# Patient Record
Sex: Female | Born: 1950
Health system: Southern US, Community
[De-identification: ages and names within clinical notes are randomized; demographics above are authoritative.]

## PROBLEM LIST (undated history)

## (undated) DIAGNOSIS — M8580 Other specified disorders of bone density and structure, unspecified site: Secondary | ICD-10-CM

## (undated) DIAGNOSIS — D649 Anemia, unspecified: Secondary | ICD-10-CM

## (undated) DIAGNOSIS — K219 Gastro-esophageal reflux disease without esophagitis: Secondary | ICD-10-CM

## (undated) DIAGNOSIS — Z860100 Personal history of colon polyps, unspecified: Secondary | ICD-10-CM

## (undated) DIAGNOSIS — Z8052 Family history of malignant neoplasm of bladder: Secondary | ICD-10-CM

## (undated) DIAGNOSIS — M1990 Unspecified osteoarthritis, unspecified site: Secondary | ICD-10-CM

## (undated) DIAGNOSIS — I10 Essential (primary) hypertension: Secondary | ICD-10-CM

## (undated) DIAGNOSIS — Z800 Family history of malignant neoplasm of digestive organs: Secondary | ICD-10-CM

## (undated) DIAGNOSIS — Z803 Family history of malignant neoplasm of breast: Secondary | ICD-10-CM

## (undated) DIAGNOSIS — J301 Allergic rhinitis due to pollen: Secondary | ICD-10-CM

## (undated) DIAGNOSIS — Z8489 Family history of other specified conditions: Secondary | ICD-10-CM

## (undated) HISTORY — DX: Personal history of colon polyps, unspecified: Z86.0100

## (undated) HISTORY — DX: Personal history of colonic polyps: Z86.010

## (undated) HISTORY — DX: Family history of malignant neoplasm of bladder: Z80.52

## (undated) HISTORY — DX: Other specified disorders of bone density and structure, unspecified site: M85.80

## (undated) HISTORY — DX: Family history of malignant neoplasm of breast: Z80.3

## (undated) HISTORY — DX: Family history of malignant neoplasm of digestive organs: Z80.0

## (undated) HISTORY — DX: Essential (primary) hypertension: I10

---

## 1989-09-11 HISTORY — PX: PARTIAL HYSTERECTOMY: SHX80

## 1989-09-11 HISTORY — PX: ABDOMINAL HYSTERECTOMY: SHX81

## 1996-09-11 HISTORY — PX: BREAST BIOPSY: SHX20

## 1998-02-23 ENCOUNTER — Other Ambulatory Visit: Admission: RE | Admit: 1998-02-23 | Discharge: 1998-02-23 | Payer: Self-pay | Admitting: Gynecology

## 1999-02-25 ENCOUNTER — Other Ambulatory Visit: Admission: RE | Admit: 1999-02-25 | Discharge: 1999-02-25 | Payer: Self-pay | Admitting: Gynecology

## 2000-03-16 ENCOUNTER — Other Ambulatory Visit: Admission: RE | Admit: 2000-03-16 | Discharge: 2000-03-16 | Payer: Self-pay | Admitting: Gynecology

## 2000-03-19 ENCOUNTER — Encounter: Payer: Self-pay | Admitting: Gynecology

## 2000-03-19 ENCOUNTER — Encounter: Admission: RE | Admit: 2000-03-19 | Discharge: 2000-03-19 | Payer: Self-pay | Admitting: Gynecology

## 2001-03-20 ENCOUNTER — Encounter: Payer: Self-pay | Admitting: Gynecology

## 2001-03-20 ENCOUNTER — Encounter: Admission: RE | Admit: 2001-03-20 | Discharge: 2001-03-20 | Payer: Self-pay | Admitting: Gynecology

## 2001-03-20 ENCOUNTER — Other Ambulatory Visit: Admission: RE | Admit: 2001-03-20 | Discharge: 2001-03-20 | Payer: Self-pay | Admitting: Gynecology

## 2002-03-24 ENCOUNTER — Encounter: Payer: Self-pay | Admitting: Gynecology

## 2002-03-24 ENCOUNTER — Encounter: Admission: RE | Admit: 2002-03-24 | Discharge: 2002-03-24 | Payer: Self-pay | Admitting: Gynecology

## 2002-03-26 ENCOUNTER — Other Ambulatory Visit: Admission: RE | Admit: 2002-03-26 | Discharge: 2002-03-26 | Payer: Self-pay | Admitting: Gynecology

## 2003-01-20 ENCOUNTER — Encounter (INDEPENDENT_AMBULATORY_CARE_PROVIDER_SITE_OTHER): Payer: Self-pay | Admitting: Specialist

## 2003-01-20 ENCOUNTER — Ambulatory Visit (HOSPITAL_COMMUNITY): Admission: RE | Admit: 2003-01-20 | Discharge: 2003-01-20 | Payer: Self-pay | Admitting: Gastroenterology

## 2003-03-26 ENCOUNTER — Encounter: Payer: Self-pay | Admitting: Gynecology

## 2003-03-26 ENCOUNTER — Encounter: Admission: RE | Admit: 2003-03-26 | Discharge: 2003-03-26 | Payer: Self-pay | Admitting: Gynecology

## 2003-04-06 ENCOUNTER — Other Ambulatory Visit: Admission: RE | Admit: 2003-04-06 | Discharge: 2003-04-06 | Payer: Self-pay | Admitting: Gynecology

## 2003-04-29 ENCOUNTER — Encounter: Admission: RE | Admit: 2003-04-29 | Discharge: 2003-06-01 | Payer: Self-pay | Admitting: Family Medicine

## 2003-05-27 ENCOUNTER — Encounter: Admission: RE | Admit: 2003-05-27 | Discharge: 2003-05-27 | Payer: Self-pay | Admitting: Family Medicine

## 2003-05-27 ENCOUNTER — Encounter: Payer: Self-pay | Admitting: Family Medicine

## 2003-07-20 ENCOUNTER — Ambulatory Visit (HOSPITAL_COMMUNITY): Admission: RE | Admit: 2003-07-20 | Discharge: 2003-07-21 | Payer: Self-pay | Admitting: Neurosurgery

## 2003-07-20 HISTORY — PX: NECK SURGERY: SHX720

## 2004-03-28 ENCOUNTER — Encounter: Admission: RE | Admit: 2004-03-28 | Discharge: 2004-03-28 | Payer: Self-pay | Admitting: Gynecology

## 2004-04-08 ENCOUNTER — Other Ambulatory Visit: Admission: RE | Admit: 2004-04-08 | Discharge: 2004-04-08 | Payer: Self-pay | Admitting: Gynecology

## 2004-04-11 ENCOUNTER — Emergency Department (HOSPITAL_COMMUNITY): Admission: EM | Admit: 2004-04-11 | Discharge: 2004-04-11 | Payer: Self-pay | Admitting: Emergency Medicine

## 2004-04-28 ENCOUNTER — Observation Stay (HOSPITAL_COMMUNITY): Admission: RE | Admit: 2004-04-28 | Discharge: 2004-04-29 | Payer: Self-pay | Admitting: Surgery

## 2004-04-28 ENCOUNTER — Encounter (INDEPENDENT_AMBULATORY_CARE_PROVIDER_SITE_OTHER): Payer: Self-pay | Admitting: *Deleted

## 2004-04-28 HISTORY — PX: CHOLECYSTECTOMY: SHX55

## 2005-03-29 ENCOUNTER — Encounter: Admission: RE | Admit: 2005-03-29 | Discharge: 2005-03-29 | Payer: Self-pay | Admitting: Gynecology

## 2005-04-10 ENCOUNTER — Other Ambulatory Visit: Admission: RE | Admit: 2005-04-10 | Discharge: 2005-04-10 | Payer: Self-pay | Admitting: Gynecology

## 2005-04-10 ENCOUNTER — Encounter: Admission: RE | Admit: 2005-04-10 | Discharge: 2005-04-10 | Payer: Self-pay | Admitting: Gynecology

## 2006-04-13 ENCOUNTER — Other Ambulatory Visit: Admission: RE | Admit: 2006-04-13 | Discharge: 2006-04-13 | Payer: Self-pay | Admitting: Gynecology

## 2006-04-13 ENCOUNTER — Encounter: Admission: RE | Admit: 2006-04-13 | Discharge: 2006-04-13 | Payer: Self-pay | Admitting: Gynecology

## 2007-04-16 ENCOUNTER — Other Ambulatory Visit: Admission: RE | Admit: 2007-04-16 | Discharge: 2007-04-16 | Payer: Self-pay | Admitting: Gynecology

## 2007-04-16 ENCOUNTER — Encounter: Admission: RE | Admit: 2007-04-16 | Discharge: 2007-04-16 | Payer: Self-pay | Admitting: Gynecology

## 2007-04-23 ENCOUNTER — Encounter: Admission: RE | Admit: 2007-04-23 | Discharge: 2007-04-23 | Payer: Self-pay | Admitting: Gynecology

## 2008-04-20 ENCOUNTER — Encounter: Admission: RE | Admit: 2008-04-20 | Discharge: 2008-04-20 | Payer: Self-pay | Admitting: Gynecology

## 2008-05-05 ENCOUNTER — Encounter: Payer: Self-pay | Admitting: Cardiovascular Disease

## 2009-04-21 ENCOUNTER — Encounter: Admission: RE | Admit: 2009-04-21 | Discharge: 2009-04-21 | Payer: Self-pay | Admitting: Gynecology

## 2009-04-23 ENCOUNTER — Encounter: Admission: RE | Admit: 2009-04-23 | Discharge: 2009-04-23 | Payer: Self-pay | Admitting: Gynecology

## 2009-09-29 ENCOUNTER — Encounter: Admission: RE | Admit: 2009-09-29 | Discharge: 2009-10-18 | Payer: Self-pay | Admitting: Family Medicine

## 2010-04-22 ENCOUNTER — Encounter: Admission: RE | Admit: 2010-04-22 | Discharge: 2010-04-22 | Payer: Self-pay | Admitting: Gynecology

## 2010-10-02 ENCOUNTER — Encounter: Payer: Self-pay | Admitting: Gynecology

## 2010-10-03 ENCOUNTER — Encounter: Payer: Self-pay | Admitting: Gynecology

## 2011-01-27 NOTE — Op Note (Signed)
NAME:  Katherine Fisher, Katherine Fisher                            ACCOUNT NO.:  [id number]   MEDICAL RECORD NO.:  [id number]                   PATIENT TYPE:  OIB   LOCATION:  2899                                 FACILITY:  [facility]   PHYSICIAN:  Clydene Fake, M.D.               DATE OF BIRTH:  June 23, 1951   DATE OF PROCEDURE:  07/20/2003  DATE OF DISCHARGE:                                 OPERATIVE REPORT   DIAGNOSES:  Herniated nucleus pulposus and spondylosis, C3-4, with right-  sided radiculopathy.   POSTOPERATIVE DIAGNOSES:  Herniated nucleus pulposus and spondylosis, C3-4,  with right-sided radiculopathy.   PROCEDURE:  Anterior cervical decompression, diskectomy and fusion at C3-4  with LifeNet bone and tether anterior cervical plate.   SURGEON:  Clydene Fake, M.D.   ASSISTANT:  Cristi Loron, M.D.   ANESTHESIA:  General endotracheal tube anesthesia.   ESTIMATED BLOOD LOSS:  Minimal.   BLOOD GIVEN:  None.   DRAINS:  None.   COMPLICATIONS:  None.   REASON FOR PRESENTATION:  The patient is a 60 year old woman who has had  neck and right-sided shoulder pain easily brought on by ___________,  improved with traction.  MRI was done showing spondylosis and disk  herniation on the right side at 3-4 and chronic foraminal stenosis, patient  brought in for decompression and fusion at that level.   PROCEDURE IN DETAIL:  The patient was brought to the operating room and  general anesthesia induced.  The patient was placed in 10 pounds of halter  traction and prepped and draped in a sterile fashion.  Site of incision was  injected with 10 mL of 1% lidocaine with epinephrine.  Incision was then  made from the midline to the anterior border of the sternomastoid muscle on  the left side of the neck and incision taken down to the level of the  platysmal and hemostasis obtained with Bovie cauterization.  The platysma  was incised with a Bovie and blunt dissection was taken from the anterior  cervical fascia to the anterior cervical spine.  Needle was placed in the  interspace and x-rays obtained showing this was indeed the 3-4 interspace.  This disk space was incised with a 15 blade and a partial diskectomy was  performed with pituitary rongeurs as the needle was removed.  The longus  colli muscle was reflected laterally on each side using the Bovie and a self-  retaining retractor was placed.  Distraction pins were placed in C3-4 and  the interspace distracted and diskectomy was then performed by again  incising the disk space with a 15 blade and performing diskectomy with  pituitary rongeurs and curettes.  Microscope was brought into the field for  microdissection at this point and 1- and 2-mm Kerrison punches were used to  remove posterior osteophytes from posterior ligaments and perform bilateral  foraminotomies after compression of the roots.  Once we had good central and  lateral decompression, a high-speed drill was used to remove the  cartilaginous endplate.  We measured the height of the disk space using the  LifeNet allograft bone trials.  It measured for a 6-mm graft.  We used a 6-  mm broach to roughen the endplates and irrigated antibiotic solution.  Wound  had good hemostasis.  We tapped a 6-mm LifeNet allograft bone into place.  There was plenty of room for treating the posterior aspect of the bone and  the dura as we checked with a nerve hook.  We removed the weights from the  traction and the distraction pins.  Hemostasis was obtained with Gelfoam  soaked in thrombin and a tether anterior cervical plate was placed over the  anterior cervical spine and two screws placed at the end of C3 and two at  C4.  These were tightened down.  Lateral x-rays were obtained showing good  position of the plate and screws and interbody bone plug, good alignment of  the spine.  Retractors were all removed and hemostasis obtained with Gelfoam  soaked in thrombin and bipolar  cauterization.  Gelfoam then irrigated out  and when we had adequate hemostasis, the platysmal was closed with 3-0  Vicryl interrupted sutures, the subcutaneous tissue was closed with the same  and the skin was closed with Benzoin and Steri-Strips.  Dressing was placed.  The patient was placed in a soft cervical collar, awoken from anesthesia and  transferred to the recovery room in stable condition.                                               Clydene Fake, M.D.    JRH/MEDQ  D:  07/20/2003  T:  07/20/2003  Job:  [id number]

## 2011-01-27 NOTE — Op Note (Signed)
NAME:  Katherine Fisher, Katherine Fisher                            ACCOUNT NO.:  [id number]   MEDICAL RECORD NO.:  [id number]                   PATIENT TYPE:  OBV   LOCATION:  1610                                 FACILITY:  [facility]   PHYSICIAN:  Currie Paris, M.D.           DATE OF BIRTH:  [birth year]   DATE OF PROCEDURE:  04/28/2004  DATE OF DISCHARGE:                                 OPERATIVE REPORT   OFFICE MEDICAL RECORD NUMBER:  [id number]   PREOPERATIVE DIAGNOSIS:  Chronic calculus cholecystitis.   POSTOPERATIVE DIAGNOSES:  1. Chronic calculus cholecystitis.  2. Multiple intra-abdominal adhesions.   OPERATION:  1. Laparoscopic cholecystectomy with operative cholangiogram.  2. Laparoscopic lysis of adhesions.   SURGEON:  Dr. Jamey Ripa   ASSISTANT:  Dr. Francina Ames   ANESTHESIA:  General endotracheal.   CLINICAL HISTORY:  This patient has been having epigastric pain associated  with nausea, first came after eating steak and [place] fries, and work-up has  shown gallstones.   DESCRIPTION OF PROCEDURE:  The patient was seen in the holding area and had  no further questions about her surgery.  She was taken to the operating room  and after satisfactory general endotracheal anesthesia had been obtained,  the abdomen was prepped and draped.  Marcaine 0.25% plain was used for the  incisions.   An umbilical incision was made, the fascia opened, and the peritoneal cavity  entered.  A Hasson was introduced and abdomen insufflated.  When I put the  camera in, I saw that we were underneath the omentum.  I could not really  get a good window into the left upper quadrant, so I took the trocar out and  tried to free up some of the omental adhesions digitally, put the trocar  back, got a little bit more exposure.  I did this one more time, but clearly  I was lucky to be able to get into the right upper quadrant at all freely.  However, I could see just to the left of the falciform, and the omentum was  stuck the entire length of the falciform.  At this point, under direct  vision, I put a 10-11 into the left upper quadrant, left epigastrium so that  we could work on some of these adhesions, and I was able to take a few  sharply down and get a little bit of the left mid abdomen freed up.  At this  point, I put a 5 mm into the left mid abdomen and using the camera in the  umbilical port and scissors and dissector through the 5 mm port, was able to  free up the adhesions of the omentum down from the anterior abdominal wall  in the right upper quadrant and from the falciform.  I then was able to  visualize the liver, the gallbladder, and the rest of the right upper  quadrant appeared to be free once these omental  adhesions came down.  At  this point, two 5 mm trocars were placed in the right upper quadrant.  The  patient was placed in reverse Trendelenburg and the procedure proceeded.  We  spent 30 minutes getting the adhesions down.   The gallbladder was retracted over the liver.  The peritoneum over the  cystic duct area was opened and the cystic duct dissected out.  The cystic  artery was closely attached to the cystic duct, and it was stripped off,  clipped, and divided.  I opened the peritoneum on either side of the  gallbladder to make a nice, big window, saw a posterior branch of the artery  which was temporarily left alone.   A clip was placed in the cystic duct at its junction with the gallbladder,  and it was opened.  Cook catheter was then introduced into the cystic duct  and held with clip and operative cholangiography done.  This showed a normal  common duct, good filling of the duodenum and pancreatic duct, and filling  of the hepatic radicles.   The cystic duct catheter was removed and three clips placed on the stay  side, and it was divided.  The small posterior branch of the cystic artery  was clipped and divided.  The gallbladder was removed from below to above  and  placed in a bag.   We irrigated and made sure everything was dry, pulled the gallbladder out  through the umbilical port.  I reinserted the port, reirrigated, and made a  final check for hemostasis to make sure there was no blood or bile leakage,  and everything appeared okay.  The ports were removed, first taking all the  5 mm out and then the umbilical port with that port site being closed with a  pursestring.  The abdomen was deflated through the epigastric port.  The  skin was closed with 4-0 Monocryl subcuticular and Dermabond.   The patient tolerated the procedure well.  There were no operative  complications.  All counts were correct.                                               Currie Paris, M.D.    CJS/MEDQ  D:  04/28/2004  T:  04/28/2004  Job:  [id number]   cc:   Sigmund Hazel, M.D.  [address]  [address] [place], [place] [address]  Fax: [phone]   Gretta Cool, M.D.  [address] [place]  [place] [address]  Fax: [phone]

## 2011-03-06 ENCOUNTER — Other Ambulatory Visit: Payer: Self-pay | Admitting: Gastroenterology

## 2011-03-31 ENCOUNTER — Other Ambulatory Visit: Payer: Self-pay | Admitting: Gynecology

## 2011-03-31 DIAGNOSIS — Z1231 Encounter for screening mammogram for malignant neoplasm of breast: Secondary | ICD-10-CM

## 2011-04-24 ENCOUNTER — Ambulatory Visit
Admission: RE | Admit: 2011-04-24 | Discharge: 2011-04-24 | Disposition: A | Discharge status: Home / Self Care | Payer: BC Managed Care – PPO | Source: Ambulatory Visit | Attending: Gynecology | Admitting: Gynecology

## 2011-04-24 DIAGNOSIS — Z1231 Encounter for screening mammogram for malignant neoplasm of breast: Secondary | ICD-10-CM

## 2011-05-04 ENCOUNTER — Other Ambulatory Visit: Payer: Self-pay | Admitting: Gynecology

## 2012-02-10 ENCOUNTER — Encounter: Payer: Self-pay | Admitting: *Deleted

## 2012-02-16 ENCOUNTER — Other Ambulatory Visit: Payer: Self-pay | Admitting: Specialist

## 2012-02-16 DIAGNOSIS — M4800 Spinal stenosis, site unspecified: Secondary | ICD-10-CM

## 2012-02-27 ENCOUNTER — Ambulatory Visit
Admission: RE | Admit: 2012-02-27 | Discharge: 2012-02-27 | Disposition: A | Discharge status: Home / Self Care | Payer: BC Managed Care – PPO | Source: Ambulatory Visit | Attending: Specialist | Admitting: Specialist

## 2012-02-27 VITALS — BP 102/60 | HR 55 | Ht 66.0 in | Wt 150.0 lb

## 2012-02-27 DIAGNOSIS — M4800 Spinal stenosis, site unspecified: Secondary | ICD-10-CM

## 2012-02-27 MED ORDER — IOHEXOL 180 MG/ML  SOLN
15.0000 mL | Freq: Once | INTRAMUSCULAR | Status: AC | PRN
  Administered 2012-02-27: 15 mL via INTRATHECAL

## 2012-02-27 MED ORDER — DIAZEPAM 5 MG PO TABS
10.0000 mg | ORAL_TABLET | Freq: Once | ORAL | Status: AC
  Administered 2012-02-27: 10 mg via ORAL

## 2012-02-27 MED ORDER — ONDANSETRON HCL 4 MG/2ML IJ SOLN: 4.0000 mg | Freq: Four times a day (QID) | INTRAMUSCULAR | Status: DC | PRN

## 2012-02-27 NOTE — Discharge Instructions (Signed)

## 2012-02-29 ENCOUNTER — Other Ambulatory Visit: Payer: Self-pay | Admitting: Gynecology

## 2012-02-29 DIAGNOSIS — Z1231 Encounter for screening mammogram for malignant neoplasm of breast: Secondary | ICD-10-CM

## 2012-04-24 ENCOUNTER — Ambulatory Visit
Admission: RE | Admit: 2012-04-24 | Discharge: 2012-04-24 | Disposition: A | Discharge status: Home / Self Care | Payer: BC Managed Care – PPO | Source: Ambulatory Visit | Attending: Gynecology | Admitting: Gynecology

## 2012-04-24 DIAGNOSIS — Z1231 Encounter for screening mammogram for malignant neoplasm of breast: Secondary | ICD-10-CM

## 2012-05-06 ENCOUNTER — Other Ambulatory Visit: Payer: Self-pay | Admitting: Gynecology

## 2012-07-06 ENCOUNTER — Other Ambulatory Visit: Payer: Self-pay | Admitting: Specialist

## 2012-07-22 ENCOUNTER — Encounter (HOSPITAL_COMMUNITY): Payer: Self-pay

## 2012-07-22 ENCOUNTER — Encounter (HOSPITAL_COMMUNITY)
Admission: RE | Admit: 2012-07-22 | Discharge: 2012-07-22 | Disposition: A | Discharge status: Home / Self Care | Payer: BC Managed Care – PPO | Source: Ambulatory Visit | Attending: Specialist | Admitting: Specialist

## 2012-07-22 HISTORY — DX: Gastro-esophageal reflux disease without esophagitis: K21.9

## 2012-07-22 HISTORY — DX: Unspecified osteoarthritis, unspecified site: M19.90

## 2012-07-22 LAB — SURGICAL PCR SCREEN
MRSA, PCR: NEGATIVE
Staphylococcus aureus: NEGATIVE

## 2012-07-22 NOTE — Patient Instructions (Signed)
20 Katherine Fisher  07/22/2012   Your procedure is scheduled on:  07/25/12 1000am-12noon  Report to [facility] Stay [facility] at 0730 AM.  Call this number if you have problems the morning of surgery: [phone]   Remember:   Do not eat food:After Midnight.  May have clear liquids:until Midnight .    Take these medicines the morning of surgery with A SIP OF WATER:   Do not wear jewelry, make-up or nail polish.  Do not wear lotions, powders, or perfumes.   Do not shave 48 hours prior to surgery.   Do not bring valuables to the hospital.  Contacts, dentures or bridgework may not be worn into surgery.  Leave suitcase in the car. After surgery it may be brought to your room.  For patients admitted to the hospital, checkout time is 11:00 AM the day of discharge.                SEE CHG INSTRUCTION SHEET    Please read over the following fact sheets that you were given: MRSA Information, Incentive Spirometry Fact sheet, coughing and deep breathing exercises, leg exercises

## 2012-07-25 ENCOUNTER — Encounter (HOSPITAL_COMMUNITY): Payer: Self-pay | Admitting: *Deleted

## 2012-07-25 ENCOUNTER — Ambulatory Visit (HOSPITAL_COMMUNITY): Payer: BC Managed Care – PPO

## 2012-07-25 ENCOUNTER — Observation Stay (HOSPITAL_COMMUNITY)
Admission: RE | Admit: 2012-07-25 | Discharge: 2012-07-26 | Disposition: A | Discharge status: Home / Self Care | Payer: BC Managed Care – PPO | Source: Ambulatory Visit | Attending: Specialist | Admitting: Specialist

## 2012-07-25 ENCOUNTER — Encounter (HOSPITAL_COMMUNITY)
Admission: RE | Disposition: A | Discharge status: Home / Self Care | Payer: Self-pay | Source: Ambulatory Visit | Attending: Specialist

## 2012-07-25 ENCOUNTER — Encounter (HOSPITAL_COMMUNITY): Payer: Self-pay | Admitting: Anesthesiology

## 2012-07-25 ENCOUNTER — Ambulatory Visit (HOSPITAL_COMMUNITY): Payer: BC Managed Care – PPO | Admitting: Anesthesiology

## 2012-07-25 DIAGNOSIS — M129 Arthropathy, unspecified: Secondary | ICD-10-CM | POA: Insufficient documentation

## 2012-07-25 DIAGNOSIS — Z01812 Encounter for preprocedural laboratory examination: Secondary | ICD-10-CM | POA: Insufficient documentation

## 2012-07-25 DIAGNOSIS — M48061 Spinal stenosis, lumbar region without neurogenic claudication: Principal | ICD-10-CM

## 2012-07-25 DIAGNOSIS — Z79899 Other long term (current) drug therapy: Secondary | ICD-10-CM | POA: Insufficient documentation

## 2012-07-25 DIAGNOSIS — K219 Gastro-esophageal reflux disease without esophagitis: Secondary | ICD-10-CM | POA: Insufficient documentation

## 2012-07-25 DIAGNOSIS — I10 Essential (primary) hypertension: Secondary | ICD-10-CM | POA: Insufficient documentation

## 2012-07-25 DIAGNOSIS — Z7982 Long term (current) use of aspirin: Secondary | ICD-10-CM | POA: Insufficient documentation

## 2012-07-25 HISTORY — PX: LUMBAR LAMINECTOMY/DECOMPRESSION MICRODISCECTOMY: SHX5026

## 2012-07-25 SURGERY — LUMBAR LAMINECTOMY/DECOMPRESSION MICRODISCECTOMY 2 LEVELS
Anesthesia: General | Laterality: Left | Wound class: Clean

## 2012-07-25 MED ORDER — BUPIVACAINE-EPINEPHRINE 0.5% -1:200000 IJ SOLN
INTRAMUSCULAR | Status: DC | PRN
  Administered 2012-07-25: 10 mL

## 2012-07-25 MED ORDER — ACETAMINOPHEN 10 MG/ML IV SOLN
INTRAVENOUS | Status: DC | PRN
  Administered 2012-07-25: 1000 mg via INTRAVENOUS

## 2012-07-25 MED ORDER — HYDROMORPHONE HCL PF 1 MG/ML IJ SOLN: 0.2500 mg | INTRAMUSCULAR | Status: DC | PRN

## 2012-07-25 MED ORDER — VITAMIN B-12 1000 MCG PO TABS
1000.0000 ug | ORAL_TABLET | Freq: Every day | ORAL | Status: DC
  Administered 2012-07-25 – 2012-07-26 (×2): 1000 ug via ORAL
  Filled 2012-07-25 (×2): qty 1

## 2012-07-25 MED ORDER — HYDROCODONE-ACETAMINOPHEN 7.5-325 MG PO TABS: 1.0000 | ORAL_TABLET | ORAL | Status: DC | PRN

## 2012-07-25 MED ORDER — BUPIVACAINE-EPINEPHRINE (PF) 0.5% -1:200000 IJ SOLN
INTRAMUSCULAR | Status: AC
  Filled 2012-07-25: qty 10

## 2012-07-25 MED ORDER — SODIUM CHLORIDE 0.9 % IR SOLN
Status: DC | PRN
  Administered 2012-07-25: 10:00:00

## 2012-07-25 MED ORDER — MIDAZOLAM HCL 5 MG/5ML IJ SOLN
INTRAMUSCULAR | Status: DC | PRN
  Administered 2012-07-25: 2 mg via INTRAVENOUS

## 2012-07-25 MED ORDER — SODIUM CHLORIDE 0.45 % IV SOLN: INTRAVENOUS | Status: DC

## 2012-07-25 MED ORDER — HEMOSTATIC AGENTS (NO CHARGE) OPTIME
TOPICAL | Status: DC | PRN
  Administered 2012-07-25: 1 via TOPICAL

## 2012-07-25 MED ORDER — LORATADINE 10 MG PO TABS
10.0000 mg | ORAL_TABLET | Freq: Every day | ORAL | Status: DC | PRN
  Filled 2012-07-25: qty 1

## 2012-07-25 MED ORDER — HYDROCODONE-ACETAMINOPHEN 5-325 MG PO TABS
1.0000 | ORAL_TABLET | ORAL | Status: DC | PRN
  Administered 2012-07-25 – 2012-07-26 (×3): 1 via ORAL
  Filled 2012-07-25 (×3): qty 1

## 2012-07-25 MED ORDER — METHOCARBAMOL 500 MG PO TABS: 500.0000 mg | ORAL_TABLET | Freq: Three times a day (TID) | ORAL | Status: DC

## 2012-07-25 MED ORDER — ONDANSETRON HCL 4 MG/2ML IJ SOLN: 4.0000 mg | INTRAMUSCULAR | Status: DC | PRN

## 2012-07-25 MED ORDER — CISATRACURIUM BESYLATE (PF) 10 MG/5ML IV SOLN
INTRAVENOUS | Status: DC | PRN
  Administered 2012-07-25: 10 mg via INTRAVENOUS

## 2012-07-25 MED ORDER — DEXAMETHASONE SODIUM PHOSPHATE 10 MG/ML IJ SOLN
INTRAMUSCULAR | Status: DC | PRN
  Administered 2012-07-25: 10 mg via INTRAVENOUS

## 2012-07-25 MED ORDER — ACETAMINOPHEN 650 MG RE SUPP: 650.0000 mg | RECTAL | Status: DC | PRN

## 2012-07-25 MED ORDER — PROMETHAZINE HCL 25 MG/ML IJ SOLN
INTRAMUSCULAR | Status: AC
  Filled 2012-07-25: qty 1

## 2012-07-25 MED ORDER — FENTANYL CITRATE 0.05 MG/ML IJ SOLN
INTRAMUSCULAR | Status: DC | PRN
  Administered 2012-07-25 (×2): 50 ug via INTRAVENOUS
  Administered 2012-07-25: 100 ug via INTRAVENOUS

## 2012-07-25 MED ORDER — LACTATED RINGERS IV SOLN: INTRAVENOUS | Status: DC

## 2012-07-25 MED ORDER — MENTHOL 3 MG MT LOZG: 1.0000 | LOZENGE | OROMUCOSAL | Status: DC | PRN

## 2012-07-25 MED ORDER — EPHEDRINE SULFATE 50 MG/ML IJ SOLN
INTRAMUSCULAR | Status: DC | PRN
  Administered 2012-07-25 (×2): 10 mg via INTRAVENOUS
  Administered 2012-07-25: 7.5 mg via INTRAVENOUS
  Administered 2012-07-25: 10 mg via INTRAVENOUS
  Administered 2012-07-25: 7.5 mg via INTRAVENOUS

## 2012-07-25 MED ORDER — ACETAMINOPHEN 325 MG PO TABS: 650.0000 mg | ORAL_TABLET | ORAL | Status: DC | PRN

## 2012-07-25 MED ORDER — ONDANSETRON HCL 4 MG/2ML IJ SOLN
INTRAMUSCULAR | Status: DC | PRN
  Administered 2012-07-25: 4 mg via INTRAVENOUS

## 2012-07-25 MED ORDER — CEFAZOLIN SODIUM-DEXTROSE 2-3 GM-% IV SOLR
2.0000 g | Freq: Three times a day (TID) | INTRAVENOUS | Status: AC
  Administered 2012-07-25 – 2012-07-26 (×2): 2 g via INTRAVENOUS
  Filled 2012-07-25 (×2): qty 50

## 2012-07-25 MED ORDER — PROMETHAZINE HCL 25 MG/ML IJ SOLN
6.2500 mg | INTRAMUSCULAR | Status: DC | PRN
  Administered 2012-07-25: 6.25 mg via INTRAVENOUS

## 2012-07-25 MED ORDER — PANTOPRAZOLE SODIUM 40 MG IV SOLR
40.0000 mg | Freq: Every day | INTRAVENOUS | Status: DC
  Filled 2012-07-25 (×2): qty 40

## 2012-07-25 MED ORDER — THROMBIN 5000 UNITS EX SOLR
CUTANEOUS | Status: DC | PRN
  Administered 2012-07-25: 10000 [IU] via TOPICAL

## 2012-07-25 MED ORDER — DOCUSATE SODIUM 100 MG PO CAPS
100.0000 mg | ORAL_CAPSULE | Freq: Two times a day (BID) | ORAL | Status: DC
  Administered 2012-07-25 – 2012-07-26 (×2): 100 mg via ORAL

## 2012-07-25 MED ORDER — LEVOCETIRIZINE DIHYDROCHLORIDE 5 MG PO TABS: 5.0000 mg | ORAL_TABLET | Freq: Every evening | ORAL | Status: DC

## 2012-07-25 MED ORDER — ACETAMINOPHEN 10 MG/ML IV SOLN
INTRAVENOUS | Status: AC
  Filled 2012-07-25: qty 100

## 2012-07-25 MED ORDER — THROMBIN 5000 UNITS EX SOLR
CUTANEOUS | Status: AC
  Filled 2012-07-25: qty 10000

## 2012-07-25 MED ORDER — CHLORHEXIDINE GLUCONATE 4 % EX LIQD: 60.0000 mL | Freq: Once | CUTANEOUS | Status: DC

## 2012-07-25 MED ORDER — MORPHINE SULFATE 2 MG/ML IJ SOLN: 1.0000 mg | INTRAMUSCULAR | Status: DC | PRN

## 2012-07-25 MED ORDER — OXYCODONE-ACETAMINOPHEN 5-325 MG PO TABS: 1.0000 | ORAL_TABLET | ORAL | Status: DC | PRN

## 2012-07-25 MED ORDER — PROPOFOL 10 MG/ML IV BOLUS
INTRAVENOUS | Status: DC | PRN
  Administered 2012-07-25: 110 mg via INTRAVENOUS

## 2012-07-25 MED ORDER — LACTATED RINGERS IV SOLN
INTRAVENOUS | Status: DC
  Administered 2012-07-25: 1000 mL via INTRAVENOUS
  Administered 2012-07-25 (×2): via INTRAVENOUS

## 2012-07-25 MED ORDER — CEFAZOLIN SODIUM-DEXTROSE 2-3 GM-% IV SOLR
2.0000 g | INTRAVENOUS | Status: AC
  Administered 2012-07-25: 2 g via INTRAVENOUS

## 2012-07-25 MED ORDER — PHENOL 1.4 % MT LIQD: 1.0000 | OROMUCOSAL | Status: DC | PRN

## 2012-07-25 SURGICAL SUPPLY — 51 items
APL SKNCLS STERI-STRIP NONHPOA (GAUZE/BANDAGES/DRESSINGS) ×2
BAG SPEC THK2 15X12 ZIP CLS (MISCELLANEOUS) ×1
BAG ZIPLOCK 12X15 (MISCELLANEOUS) ×2 IMPLANT
BENZOIN TINCTURE PRP APPL 2/3 (GAUZE/BANDAGES/DRESSINGS) ×4 IMPLANT
CHLORAPREP W/TINT 26ML (MISCELLANEOUS) IMPLANT
CLEANER TIP ELECTROSURG 2X2 (MISCELLANEOUS) ×2 IMPLANT
CLOTH BEACON ORANGE TIMEOUT ST (SAFETY) ×2 IMPLANT
DECANTER SPIKE VIAL GLASS SM (MISCELLANEOUS) ×2 IMPLANT
DRAPE MICROSCOPE LEICA (MISCELLANEOUS) ×2 IMPLANT
DRAPE POUCH INSTRU U-SHP 10X18 (DRAPES) ×2 IMPLANT
DRAPE SURG 17X11 SM STRL (DRAPES) ×2 IMPLANT
DRSG EMULSION OIL 3X3 NADH (GAUZE/BANDAGES/DRESSINGS) ×1 IMPLANT
DRSG PAD ABDOMINAL 8X10 ST (GAUZE/BANDAGES/DRESSINGS) IMPLANT
DRSG TELFA 4X5 ISLAND ADH (GAUZE/BANDAGES/DRESSINGS) IMPLANT
DRSG TELFA PLUS 4X6 ADH ISLAND (GAUZE/BANDAGES/DRESSINGS) ×1 IMPLANT
DURAPREP 26ML APPLICATOR (WOUND CARE) ×2 IMPLANT
ELECT BLADE TIP CTD 4 INCH (ELECTRODE) ×1 IMPLANT
ELECT REM PT RETURN 9FT ADLT (ELECTROSURGICAL) ×2
ELECTRODE REM PT RTRN 9FT ADLT (ELECTROSURGICAL) ×1 IMPLANT
GLOVE BIOGEL PI IND STRL 8 (GLOVE) ×1 IMPLANT
GLOVE BIOGEL PI INDICATOR 8 (GLOVE) ×1
GLOVE ECLIPSE 6.5 STRL STRAW (GLOVE) ×2 IMPLANT
GLOVE INDICATOR 6.5 STRL GRN (GLOVE) ×4 IMPLANT
GLOVE SURG SS PI 8.0 STRL IVOR (GLOVE) ×4 IMPLANT
GOWN PREVENTION PLUS LG XLONG (DISPOSABLE) ×2 IMPLANT
GOWN STRL REIN XL XLG (GOWN DISPOSABLE) ×2 IMPLANT
KIT BASIN OR (CUSTOM PROCEDURE TRAY) ×2 IMPLANT
KIT POSITIONING SURG ANDREWS (MISCELLANEOUS) ×2 IMPLANT
MANIFOLD NEPTUNE II (INSTRUMENTS) ×2 IMPLANT
NDL SPNL 18GX3.5 QUINCKE PK (NEEDLE) ×3 IMPLANT
NEEDLE SPNL 18GX3.5 QUINCKE PK (NEEDLE) ×6 IMPLANT
PATTIES SURGICAL .5 X.5 (GAUZE/BANDAGES/DRESSINGS) IMPLANT
PATTIES SURGICAL .75X.75 (GAUZE/BANDAGES/DRESSINGS) IMPLANT
PATTIES SURGICAL 1X1 (DISPOSABLE) IMPLANT
SPONGE SURGIFOAM ABS GEL 100 (HEMOSTASIS) ×2 IMPLANT
STAPLER VISISTAT (STAPLE) IMPLANT
STRIP CLOSURE SKIN 1/2X4 (GAUZE/BANDAGES/DRESSINGS) ×1 IMPLANT
SUT PROLENE 3 0 PS 2 (SUTURE) IMPLANT
SUT VIC AB 0 CT1 27 (SUTURE)
SUT VIC AB 0 CT1 27XBRD ANTBC (SUTURE) IMPLANT
SUT VIC AB 1 CT1 27 (SUTURE) ×2
SUT VIC AB 1 CT1 27XBRD ANTBC (SUTURE) ×1 IMPLANT
SUT VIC AB 1-0 CT2 27 (SUTURE) IMPLANT
SUT VIC AB 2-0 CT1 27 (SUTURE) ×2
SUT VIC AB 2-0 CT1 TAPERPNT 27 (SUTURE) ×1 IMPLANT
SUT VIC AB 2-0 CT2 27 (SUTURE) ×4 IMPLANT
SUT VICRYL 0 27 CT2 27 ABS (SUTURE) ×4 IMPLANT
SUT VICRYL 0 UR6 27IN ABS (SUTURE) IMPLANT
SYRINGE 10CC LL (SYRINGE) ×4 IMPLANT
TRAY LAMINECTOMY (CUSTOM PROCEDURE TRAY) ×2 IMPLANT
YANKAUER SUCT BULB TIP NO VENT (SUCTIONS) ×2 IMPLANT

## 2012-07-25 NOTE — Brief Op Note (Signed)
07/25/2012  12:34 PM  PATIENT:  Katherine Fisher  61 y.o. female  PRE-OPERATIVE DIAGNOSIS:  Spinal stenosis L45  POST-OPERATIVE DIAGNOSIS:  Spinal stenosis  PROCEDURE:  Procedure(s) (LRB) with comments: LUMBAR LAMINECTOMY/DECOMPRESSION MICRODISCECTOMY 2 LEVELS (Left) - Lumbar decompression L3-L4, L4 - L5 on the Left (X-Ray)  SURGEON:  Surgeon(s) and Role:    * Javier Docker, MD - Primary  PHYSICIAN ASSISTANT:   ASSISTANTS: Irish Elders   ANESTHESIA:   general  EBL:  Total I/O In: 2000 [I.V.:2000] Out: -   BLOOD ADMINISTERED:none  DRAINS: none   LOCAL MEDICATIONS USED:  MARCAINE     SPECIMEN:  No Specimen  DISPOSITION OF SPECIMEN:  N/A  COUNTS:  YES  TOURNIQUET:  * No tourniquets in log *  DICTATION: .Other Dictation: Dictation Number [id number]  PLAN OF CARE: Admit for overnight observation  PATIENT DISPOSITION:  PACU - hemodynamically stable.   Delay start of Pharmacological VTE agent (>24hrs) due to surgical blood loss or risk of bleeding: yes

## 2012-07-25 NOTE — H&P (Signed)
Katherine Fisher is an 61 y.o. female.   Chief Complaint: Left leg pain HPI: L5 radiculopathy refractory  Past Medical History  Diagnosis Date  . HTN (hypertension)   . GERD (gastroesophageal reflux disease)     hx of   . Arthritis     lower anemia     Past Surgical History  Procedure Date  . Partial hysterectomy 1991  . Cholecystectomy 2006  . Breast biopsy 1998    x2 on right     Family History  Problem Relation Age of Onset  . Heart attack Father   . Hypertension Mother   . Heart failure Brother   . Heart failure Brother    Social History:  reports that she has never smoked. She has never used smokeless tobacco. She reports that she does not drink alcohol or use illicit drugs.  Allergies: No Known Allergies  Medications Prior to Admission  Medication Sig Dispense Refill  . Cholecalciferol (D3 SUPER STRENGTH) 2000 UNITS CAPS Take by mouth daily.       . Cyclobenzaprine HCl (FLEXERIL PO) Take 10 mg by mouth every 6 (six) hours as needed. spasms      . fluticasone (FLONASE) 50 MCG/ACT nasal spray Place 2 sprays into the nose daily as needed. allergies      . levocetirizine (XYZAL) 5 MG tablet Take 5 mg by mouth every evening. As needed      . lisinopril-hydrochlorothiazide (PRINZIDE,ZESTORETIC) 20-12.5 MG per tablet Take 1 tablet by mouth every morning.       . naproxen (NAPROSYN) 500 MG tablet Take 500 mg by mouth 2 (two) times daily as needed. Pain      . aspirin 81 MG tablet Take 81 mg by mouth every morning.       . conjugated estrogens (PREMARIN) vaginal cream Place 1 g vaginally See admin instructions. Twice weekly (Sunday and Tuesday)      . fish oil-omega-3 fatty acids 1000 MG capsule Take 1 g by mouth daily.       . vitamin B-12 (CYANOCOBALAMIN) 1000 MCG tablet Take 1,000 mcg by mouth daily.        No results found for this or any previous visit (from the past 48 hour(s)). No results found.  Review of Systems  Neurological: Positive for tingling and focal  weakness.  All other systems reviewed and are negative.    Blood pressure 127/76, pulse 68, temperature 97.5 F (36.4 C), temperature source Oral, resp. rate 18, height 5\' 4"  (1.626 m), weight 67.132 kg (148 lb), SpO2 99.00%. Physical Exam  Vitals reviewed. Constitutional: She is oriented to person, place, and time. She appears well-developed.  HENT:  Head: Normocephalic.  Eyes: Pupils are equal, round, and reactive to light.  Neck: Normal range of motion.  Cardiovascular: Normal rate.   Respiratory: Effort normal.  GI: Soft.  Musculoskeletal:       +SLR left. EHL 4/5 left. No dvt. 2+pulses  Neurological: She is alert and oriented to person, place, and time.  Skin: Skin is warm and dry.  Psychiatric: She has a normal mood and affect.  MRI stenosis L45 min listhesis left.   Assessment/Plan L45 stenosis left with L5 radiculopathy refractory. Plan decompression. Risks discussed. Min back pain  Elvera Almario C 07/25/2012, 9:23 AM

## 2012-07-25 NOTE — Anesthesia Postprocedure Evaluation (Signed)
Anesthesia Post Note  Patient: Katherine Fisher  Procedure(s) Performed: Procedure(s) (LRB): LUMBAR LAMINECTOMY/DECOMPRESSION MICRODISCECTOMY 2 LEVELS (Left)  Anesthesia type: General  Patient location: PACU  Post pain: Pain level controlled  Post assessment: Post-op Vital signs reviewed  Last Vitals:  Filed Vitals:   07/25/12 1345  BP: 124/63  Pulse: 87  Temp:   Resp: 13    Post vital signs: Reviewed  Level of consciousness: sedated  Complications: No apparent anesthesia complications

## 2012-07-25 NOTE — Anesthesia Preprocedure Evaluation (Signed)
Anesthesia Evaluation  Patient identified by MRN, date of birth, ID band Patient awake    Reviewed: Allergy & Precautions, H&P , NPO status , Patient's Chart, lab work & pertinent test results  Airway Mallampati: II TM Distance: >3 FB Neck ROM: Full    Dental  (+) Caps, Missing, Teeth Intact and Dental Advisory Given,    Pulmonary neg pulmonary ROS,  breath sounds clear to auscultation  Pulmonary exam normal       Cardiovascular hypertension, Pt. on medications Rhythm:Regular     Neuro/Psych negative neurological ROS  negative psych ROS   GI/Hepatic Neg liver ROS, GERD-  Medicated,  Endo/Other  negative endocrine ROS  Renal/GU negative Renal ROS  negative genitourinary   Musculoskeletal negative musculoskeletal ROS (+)   Abdominal   Peds  Hematology negative hematology ROS (+)   Anesthesia Other Findings   Reproductive/Obstetrics negative OB ROS                           Anesthesia Physical Anesthesia Plan  ASA: II  Anesthesia Plan: General   Post-op Pain Management:    Induction: Intravenous  Airway Management Planned: Oral ETT  Additional Equipment:   Intra-op Plan:   Post-operative Plan: Extubation in OR  Informed Consent: I have reviewed the patients History and Physical, chart, labs and discussed the procedure including the risks, benefits and alternatives for the proposed anesthesia with the patient or authorized representative who has indicated his/her understanding and acceptance.   Dental advisory given  Plan Discussed with: CRNA  Anesthesia Plan Comments:         Anesthesia Quick Evaluation

## 2012-07-25 NOTE — Transfer of Care (Signed)
Immediate Anesthesia Transfer of Care Note  Patient: Katherine Fisher  Procedure(s) Performed: Procedure(s) (LRB) with comments: LUMBAR LAMINECTOMY/DECOMPRESSION MICRODISCECTOMY 2 LEVELS (Left) - Lumbar decompression L3-L4, L4 - L5 on the Left (X-Ray)  Patient Location: PACU  Anesthesia Type:General  Level of Consciousness: awake, sedated and patient cooperative  Airway & Oxygen Therapy: Patient Spontanous Breathing and Patient connected to face mask oxygen  Post-op Assessment: Report given to PACU RN and Post -op Vital signs reviewed and stable  Post vital signs: Reviewed and stable  Complications: No apparent anesthesia complications

## 2012-07-26 ENCOUNTER — Encounter (HOSPITAL_COMMUNITY): Payer: Self-pay | Admitting: Specialist

## 2012-07-26 DIAGNOSIS — M48061 Spinal stenosis, lumbar region without neurogenic claudication: Secondary | ICD-10-CM

## 2012-07-26 NOTE — Progress Notes (Signed)
Subjective: 1 Day Post-Op Procedure(s) (LRB): LUMBAR LAMINECTOMY/DECOMPRESSION MICRODISCECTOMY 2 LEVELS (Left) Patient reports pain as 3 on 0-10 scale.    Objective: Vital signs in last 24 hours: Temp:  [94.3 F (34.6 C)-98.1 F (36.7 C)] 97.2 F (36.2 C) (11/15 0515) Pulse Rate:  [63-97] 63  (11/15 0515) Resp:  [8-20] 16  (11/15 0515) BP: (100-133)/(59-118) 107/68 mmHg (11/15 0515) SpO2:  [96 %-100 %] 100 % (11/15 0515) Weight:  [67 kg (147 lb 11.3 oz)] 67 kg (147 lb 11.3 oz) (11/14 1720)  Intake/Output from previous day: 11/14 0701 - 11/15 0700 In: 3620 [P.O.:120; I.V.:3450; IV Piggyback:50] Out: 350 [Urine:350] Intake/Output this shift: Total I/O In: 770 [P.O.:120; I.V.:600; IV Piggyback:50] Out: 150 [Urine:150]  No results found for this basename: HGB:5 in the last 72 hours No results found for this basename: WBC:2,RBC:2,HCT:2,PLT:2 in the last 72 hours No results found for this basename: NA:2,K:2,CL:2,CO2:2,BUN:2,CREATININE:2,GLUCOSE:2,CALCIUM:2 in the last 72 hours No results found for this basename: LABPT:2,INR:2 in the last 72 hours  Neurologically intact Neurovascular intact Sensation intact distally Incision: dressing C/D/I  Assessment/Plan: 1 Day Post-Op Procedure(s) (LRB): LUMBAR LAMINECTOMY/DECOMPRESSION MICRODISCECTOMY 2 LEVELS (Left) Discharge home with home health instructions given  Madelline Eshbach C 07/26/2012, 6:44 AM

## 2012-07-26 NOTE — Evaluation (Signed)
Occupational Therapy Evaluation Patient Details Name: Katherine Fisher MRN: [id number] DOB: [birth year] Today's Date: 07/26/2012 Time: 4098-1191 OT Time Calculation (min): 10 min  OT Assessment / Plan / Recommendation Clinical Impression  This 61 year old female is s/p 2 level decompression.  All education was completed.  Pt does not need further OT nor dme.      OT Assessment       Follow Up Recommendations  No OT follow up    Barriers to Discharge      Equipment Recommendations  None recommended by PT    Recommendations for Other Services    Frequency       Precautions / Restrictions Precautions Precautions: Back Precaution Comments: pt given handout   Pertinent Vitals/Pain 1/10 back    ADL  Transfers/Ambulation Related to ADLs: Pt is moving very well.  Does not need any modification for shower transfers.  Reinforced using legs and keeping back straight ADL Comments: reviewed adls and back precautions with pt.  She used to bend forward to don LB clothing.  She does have a reacher:  educated to throw pants on floor if she uses this due to weight.  Also educated on lying in bed to don. Pt has 3:1 from husband's recent knee surgery.  Educated on toileting and toilet aide if needed.  Pt verbalizes all education.      OT Diagnosis:    OT Problem List:   OT Treatment Interventions:     OT Goals    Visit Information  Last OT Received On: 07/26/12 Assistance Needed: +1    Subjective Data  Subjective: I've been reading the handout   Prior Functioning     Home Living Lives With: Spouse Type of Home: House Home Access: Ramped entrance Home Layout: Able to live on main level with bedroom/bathroom Bathroom Shower/Tub: Health visitor: Standard Home Adaptive Equipment: Environmental consultant - rolling;Raised toilet seat with rails Prior Function Level of Independence: Independent Communication Communication: No difficulties         Vision/Perception       Cognition  Overall Cognitive Status: Appears within functional limits for tasks assessed/performed Arousal/Alertness: Awake/alert Orientation Level: Appears intact for tasks assessed Behavior During Session: [facility] for tasks performed    Extremity/Trunk Assessment Right Upper Extremity Assessment RUE ROM/Strength/Tone: [facility] for tasks assessed Left Upper Extremity Assessment     Mobility      Shoulder Instructions     Exercise     Balance     End of Session OT - End of Session Activity Tolerance: Patient tolerated treatment well Patient left: in chair;with call bell/phone within reach;with family/visitor present  GO     [name] 07/26/2012, 9:30 AM Marica Otter, OTR/L [phone] 07/26/2012

## 2012-07-26 NOTE — Evaluation (Signed)
Physical Therapy Evaluation Patient Details Name: Midori Dado MRN: [id number] DOB: 1951-05-22 Today's Date: 07/26/2012 Time: 4098-1191 PT Time Calculation (min): 11 min  PT Assessment / Plan / Recommendation Clinical Impression  Pt s/p micro lumbar decompression L4-5 with foraminotomies at L4, L5.  Pt educated on back precautions and provided with handout.  Pt ambulating well without assistive device.  Pt agrees to not require any further PT and feels ready to d/c home today.    PT Assessment  Patent does not need any further PT services    Follow Up Recommendations  No PT follow up    Does the patient have the potential to tolerate intense rehabilitation      Barriers to Discharge        Equipment Recommendations  None recommended by PT    Recommendations for Other Services     Frequency      Precautions / Restrictions Precautions Precautions: Back Precaution Comments: pt given handout   Pertinent Vitals/Pain 2/10 low back pain, premedicated, repositioned      Mobility  Bed Mobility Bed Mobility: Rolling Right;Right Sidelying to Sit Rolling Right: 5: Supervision Right Sidelying to Sit: 5: Supervision Details for Bed Mobility Assistance: verbal cues for log roll technique, flat bed, no assist required Transfers Transfers: Sit to Stand;Stand to Sit Sit to Stand: 5: Supervision;With upper extremity assist;From bed Stand to Sit: 5: Supervision;To chair/3-in-1 Ambulation/Gait Ambulation/Gait Assistance: 5: Supervision Ambulation Distance (Feet): 400 Feet Assistive device: None Ambulation/Gait Assistance Details: pt reports no difference between LEs in strength and denies numbness and tingling Gait Pattern: Step-through pattern Gait velocity: decreased    Shoulder Instructions     Exercises     PT Diagnosis:    PT Problem List:   PT Treatment Interventions:     PT Goals    Visit Information  Last PT Received On: 07/26/12 Assistance Needed: +1      Subjective Data  Subjective: I'm feeling good.   Prior Functioning  Home Living Lives With: Spouse Type of Home: House Home Access: Ramped entrance Home Layout: Able to live on main level with bedroom/bathroom Home Adaptive Equipment: Walker - rolling;Raised toilet seat with rails Prior Function Level of Independence: Independent Communication Communication: No difficulties    Cognition  Overall Cognitive Status: Appears within functional limits for tasks assessed/performed Arousal/Alertness: Awake/alert Orientation Level: Appears intact for tasks assessed Behavior During Session: [facility] for tasks performed    Extremity/Trunk Assessment Right Upper Extremity Assessment RUE ROM/Strength/Tone: [facility] for tasks assessed Left Upper Extremity Assessment LUE ROM/Strength/Tone: [facility] for tasks assessed Right Lower Extremity Assessment RLE ROM/Strength/Tone: [facility] for tasks assessed Left Lower Extremity Assessment LLE ROM/Strength/Tone: [facility] for tasks assessed   Balance    End of Session PT - End of Session Activity Tolerance: Patient tolerated treatment well Patient left: in chair;with call bell/phone within reach;with family/visitor present  GP Functional Assessment Tool Used: clinincal judgement Functional Limitation: Mobility: Walking and moving around Mobility: Walking and Moving Around Current Status (Y7829): At least 1 percent but less than 20 percent impaired, limited or restricted Mobility: Walking and Moving Around Goal Status [phone]): At least 1 percent but less than 20 percent impaired, limited or restricted Mobility: Walking and Moving Around Discharge Status [phone]): At least 1 percent but less than 20 percent impaired, limited or restricted   Neriah Brott,KATHrine E 07/26/2012, 9:08 AM Pager: [phone]

## 2012-07-26 NOTE — Op Note (Signed)
NAMEAMERICA, SANDALL                ACCOUNT NO.:  [id number]  MEDICAL RECORD NO.:  [id number]  LOCATION:  1610                         FACILITY:  [facility]  PHYSICIAN:  Jene Every, M.D.    DATE OF BIRTH:  [birth year]  DATE OF PROCEDURE:  07/25/2012 DATE OF DISCHARGE:                              OPERATIVE REPORT   PREOPERATIVE DIAGNOSIS:  Spinal stenosis at L4-5.  POSTOPERATIVE DIAGNOSIS:  Spinal stenosis at L4-5, L3-4.  PROCEDURES PERFORMED: 1. Micro lumbar decompression L4-5 with foraminotomies at L4, L5. 2. Decompression L3-4, foraminotomy of L4. 3. Lysis of adhesions at L4-5.  ANESTHESIA:  General.  ASSISTANT:  Lanna Poche, PA  HISTORY:  A 61 year old female with L5 radiculopathy, L4 radiculopathy secondary to a minimal listhesis at L4-5 thought to be facet arthrosis. She had no instability of flexion, extension and no back pain.  Positive neural tension signs, EHL weakness.  Temporary relief from an epidural. She was indicated for decompression at L4-5 and foraminotomy.  Risks and benefits discussed including bleeding, infection, damage to neurovascular structures, no change in symptoms or worsening symptoms, need for repeat debridement, DVT, PE, anesthetic complications, etc.  TECHNIQUE:  With the patient in supine position, after induction of adequate general anesthesia, 2 g Kefzol, placed prone on the Terral frame.  All bony prominences were well padded.  Lumbar region was prepped and draped in the usual sterile fashion.  An 18-gauge spinal needle was utilized to localize L4-5 interspace, confirmed with x-ray. Incision was made from spinous process of L4-5.  Subcutaneous tissue was dissected.  Electrocautery was utilized to achieve hemostasis. Dorsolumbar fascia identified, divided, along the skin incision. Paraspinous muscle elevated from lamina in L4-5.  McCullough retractor was placed.  We obtained a radiograph with a McCullough over the disk space at L4-5.   There was signaling noted of the lumbar vertebral body in slight rotation.  We performed a detached ligamentum flavum from the cephalad edge of the lamina, which was partially removed ligamentum flavum from the interspace.  This was felt to be the lamina L5. Obtained a radiograph with a hockey stick in the inner laminar space at L2.  It however indicated the space was at L3-4.  There was hypertrophic ligamentum there though and stenotic foramen.  I redirected the Archer Asa to the spaces beneath it, again significant signaling of the L4 was noted in a very small interlaminar space of L4-5.  I performed hemilaminotomy of the caudad edge of L4 using micro curette and a straight curette to detach ligamentum flavum, the cephalad edge of L5, removed ligamentum from the interspace, it was hypertrophic ligamentum. There was significant adhesions of both the thecal sac laterally, vascularly, as well as adhesions tethering the thecal sac 4 and the 5 root.  I checked the 5 root and performed a foraminotomy, decompressed lateral recess to the medial border of the pedicle.  We removed a minor portion of the medial aspect of the facet.  We meticulously mobilized the thecal sac in the 5 root from the tethering vascular leash cauterizing and dividing it.  There was tethering noted at the proximal shoulder of the 5 root and extending up to the lateral aspect the  origin of the 4 root.  We carefully performed foraminotomy of 4.  The hockey- stick probe passed freely up to foramen L5.  However, again we were having tethering of the 4 root and we were unable to pass the [employer] retractors cephalad to the pedicle 4, therefore, went to the 3-4 space above that performed a foraminotomy of 4, removed ligamentum flavum, mobilized the 4 root from above and below and we were able to free adhesions of the thecal sac and the 4 root, and we therefore, had 1 cm excursion of 5 and the 4 root medial pedicle without  difficulty.  No evidence of disk herniation.  No evidence of CSF leakage or active bleeding.  We copiously irrigated with antibiotic irrigation.  We obtained a confirmatory radiograph with the [facility] in the foramen of 5,  no CSF leakage or active bleeding.  Performed Valsalva maneuver, no CSF leakage.  We copiously irrigated with antibiotic irrigation, brought thrombin-soaked Gelfoam in the interlaminar windows.  We removed the McCullough retractors, the paraspinous muscle was inspected with no evidence of active bleeding.  Dorsolumbar fascia reapproximated with 1 Vicryl in a figure-of-eight sutures, subcutaneous  with 2-0 Vicryl simple sutures.  Skin was reapproximated 4-0 subcuticular Prolene. Wound reinforced with Steri-Strips.  Sterile dressing was applied.  We placed thrombin-soaked Gelfoam in the interlaminar space.  She was placed supine on hospital bed, extubated without difficulty, and transported to recovery in satisfactory condition.  The patient tolerated the procedure well.  No complications.  Assisted by Lanna Poche, PA.  Minimal blood loss.     Jene Every, M.D.     Cordelia Pen  D:  07/25/2012  T:  07/26/2012  Job:  [id number]

## 2012-07-26 NOTE — Discharge Summary (Signed)
Physician Discharge Summary   Patient ID: Katherine Fisher MRN: [id number] DOB/AGE: 02/18/1951 61 y.o.  Admit date: 07/25/2012 Discharge date: 07/26/2012  Primary Diagnosis:   Spinal stenosis  Admission Diagnoses:  Past Medical History  Diagnosis Date  . HTN (hypertension)   . GERD (gastroesophageal reflux disease)     hx of   . Arthritis     lower anemia    Discharge Diagnoses:   Active Problems:  * No active hospital problems. *   Procedure:  Procedure(s) (LRB): LUMBAR LAMINECTOMY/DECOMPRESSION MICRODISCECTOMY 2 LEVELS (Left)   Consults: None  HPI:  See notes    Laboratory Data: Hospital Outpatient Visit on 07/22/2012  Component Date Value Range Status  . MRSA, PCR 07/22/2012 NEGATIVE  NEGATIVE Final  . Staphylococcus aureus 07/22/2012 NEGATIVE  NEGATIVE Final   Comment:                                 The Xpert SA Assay (FDA                          approved for NASAL specimens                          in patients over 88 years of age),                          is one component of                          a comprehensive surveillance                          program.  Test performance has                          been validated by [employer] for patients greater                          than or equal to 85 year old.                          It is not intended                          to diagnose infection nor to                          guide or monitor treatment.   No results found for this basename: HGB:5 in the last 72 hours No results found for this basename: WBC:2,RBC:2,HCT:2,PLT:2 in the last 72 hours No results found for this basename: NA:2,K:2,CL:2,CO2:2,BUN:2,CREATININE:2,GLUCOSE:2,CALCIUM:2 in the last 72 hours No results found for this basename: LABPT:2,INR:2 in the last 72 hours  X-Rays:Dg Spine Portable 1 View  07/25/2012  *RADIOLOGY REPORT*  Clinical Data: Surgery at the L4-L5 level.  PORTABLE SPINE - 1 VIEW   Comparison: 07/25/2012 at the 10:51 a.m.  Findings: There is a soft tissue retractor in place posterior to L4.  Surgical probes are in place, one with the tip posterior to the lower third of the L3 vertebral body, and one with the tip posterior to L4 just beneath the mid vertebral body.  IMPRESSION: Intraoperative localization, as above.   Original Report Authenticated By: Trudie Reed, M.D.    Dg Spine Portable 1 View  07/25/2012  *RADIOLOGY REPORT*  Clinical Data: Lumbar spine surgery  PORTABLE SPINE - 1 VIEW  Comparison: Portable exam labeled #4 at 1211 hours compared to 1129 hours  Findings: Five lumbar vertebrae are labeled on the prior lumbar CT myelogram of 02/27/2012. Metallic probe via posterior approach is at the mid to inferior L5 level. Surgical instruments are present at the L4-5 disc space level and at L5.  IMPRESSION: Posterior localization as above.   Original Report Authenticated By: Ulyses Southward, M.D.    Dg Spine Portable 1 View  07/25/2012  *RADIOLOGY REPORT*  Clinical Data: Lumbar surgery at L4-L5  PORTABLE SPINE - 1 VIEW  Comparison: Portal exam labeled#2 at 1051 hours compared to 1034 hours correlated with prior lumbar CT myelogram of 02/27/2012  Findings: Five non-rib bearing lumbar vertebrae labeled on prior CT. Surgical hardware from posterior approach is present at the inferior endplate of L4 and superior endplate of L5.  IMPRESSION: Posterior localization of the L4-L5 disc space as above.   Original Report Authenticated By: Ulyses Southward, M.D.    Dg Spine Portable 1 View  07/25/2012  *RADIOLOGY REPORT*  Clinical Data: Lumbar surgery  PORTABLE SPINE - 1 VIEW  Comparison: Portable intraoperative lateral view of the lumbar spine labeled #1 at 1034 hours correlated with prior lumbar CT myelogram of 02/27/2012  Findings: Five lumbar vertebrae labeled on prior CT myelogram. Tip of a metallic probe projects over the dorsal aspect of the spinous process of L4 at the superior L5 level. No  acute bony abnormalities identified.  IMPRESSION: Posterior localization of L4-L5 as above.   Original Report Authenticated By: Ulyses Southward, M.D.     EKG:No orders found for this or any previous visit.   Hospital Course: Patient was admitted to [facility] and taken to the OR and underwent the above state procedure without complications.  Patient tolerated the procedure well and was later transferred to the recovery room and then to the orthopaedic floor for postoperative care.  They were given PO and IV analgesics for pain control following their surgery.  They were given 24 hours of postoperative antibiotics.   PT was consulted postop to assist with mobility and transfers.  The patient was allowed to be WBAT with therapy and was taught back precautions. Discharge planning was consulted to help with postop disposition and equipment needs.  Patient had a good night on the evening of surgery and started to get up OOB with therapy on day one. Patient was seen in rounds and was ready to go home on day one.  They were given discharge instructions and dressing directions.  They were instructed on when to follow up in the office with Dr. Shelle Iron.  Discharge Medications: Prior to Admission medications   Medication Sig Start Date End Date Taking? Authorizing Provider  Cholecalciferol (D3 SUPER STRENGTH) 2000 UNITS CAPS Take by mouth daily.    Yes Historical Provider, MD  fluticasone (FLONASE) 50 MCG/ACT nasal spray Place 2 sprays into the nose daily as needed. allergies   Yes Historical Provider, MD  levocetirizine (XYZAL) 5 MG tablet Take 5 mg by mouth every evening. As needed   Yes Historical Provider,  MD  lisinopril-hydrochlorothiazide (PRINZIDE,ZESTORETIC) 20-12.5 MG per tablet Take 1 tablet by mouth every morning.    Yes Historical Provider, MD  conjugated estrogens (PREMARIN) vaginal cream Place 1 g vaginally See admin instructions. Twice weekly (Sunday and Tuesday)    Historical Provider, MD    fish oil-omega-3 fatty acids 1000 MG capsule Take 1 g by mouth daily.     Historical Provider, MD  HYDROcodone-acetaminophen (NORCO) 7.5-325 MG per tablet Take 1-2 tablets by mouth every 4 (four) hours as needed for pain. 07/25/12   Javier Docker, MD  methocarbamol (ROBAXIN) 500 MG tablet Take 1 tablet (500 mg total) by mouth 3 (three) times daily. 07/25/12   Javier Docker, MD  vitamin B-12 (CYANOCOBALAMIN) 1000 MCG tablet Take 1,000 mcg by mouth daily.    Historical Provider, MD    Diet: Regular diet Activity:WBAT Follow-up:in 10 days Disposition - Home Discharged Condition: good   Discharge Orders    Future Orders Please Complete By Expires   Diet - low sodium heart healthy      Call MD / Call 911      Comments:   If you experience chest pain or shortness of breath, CALL 911 and be transported to the hospital emergency room.  If you develope a fever above 101 F, pus (white drainage) or increased drainage or redness at the wound, or calf pain, call your surgeon's office.   Constipation Prevention      Comments:   Drink plenty of fluids.  Prune juice may be helpful.  You may use a stool softener, such as Colace (over the counter) 100 mg twice a day.  Use MiraLax (over the counter) for constipation as needed.   Increase activity slowly as tolerated          Medication List     As of 07/26/2012  6:47 AM    STOP taking these medications         aspirin 81 MG tablet      FLEXERIL PO      naproxen 500 MG tablet   Commonly known as: NAPROSYN      TAKE these medications         conjugated estrogens vaginal cream   Commonly known as: PREMARIN   Place 1 g vaginally See admin instructions. Twice weekly (Sunday and Tuesday)      D3 SUPER STRENGTH 2000 UNITS Caps   Generic drug: Cholecalciferol   Take by mouth daily.      fish oil-omega-3 fatty acids 1000 MG capsule   Take 1 g by mouth daily.      fluticasone 50 MCG/ACT nasal spray   Commonly known as: FLONASE   Place  2 sprays into the nose daily as needed. allergies      HYDROcodone-acetaminophen 7.5-325 MG per tablet   Commonly known as: NORCO   Take 1-2 tablets by mouth every 4 (four) hours as needed for pain.      levocetirizine 5 MG tablet   Commonly known as: XYZAL   Take 5 mg by mouth every evening. As needed      lisinopril-hydrochlorothiazide 20-12.5 MG per tablet   Commonly known as: PRINZIDE,ZESTORETIC   Take 1 tablet by mouth every morning.      methocarbamol 500 MG tablet   Commonly known as: ROBAXIN   Take 1 tablet (500 mg total) by mouth 3 (three) times daily.      vitamin B-12 1000 MCG tablet   Commonly known as: CYANOCOBALAMIN  Take 1,000 mcg by mouth daily.           Follow-up Information    Follow up with Dynastee Brummell C, MD. In 10 days.   Contact information:   [address] [place] [address] [place] [address] [phone]          Signed: Javier Docker 07/26/2012, 6:47 AM

## 2012-07-29 NOTE — Progress Notes (Signed)
07/26/12 0906  OT G-codes **NOT FOR INPATIENT CLASS**  Functional Assessment Tool Used clincial Judgement per chart review  Functional Limitation Self care  Self Care Current Status (J4782) CI  Self Care Goal Status (N5621) CI   G-Codes entered by Judithann Sauger, OTR upon chart review of evaluation performed by Marica Otter, OT.

## 2012-10-26 ENCOUNTER — Other Ambulatory Visit: Payer: Self-pay

## 2013-01-14 ENCOUNTER — Other Ambulatory Visit: Payer: Self-pay

## 2013-01-14 DIAGNOSIS — Z1231 Encounter for screening mammogram for malignant neoplasm of breast: Secondary | ICD-10-CM

## 2013-04-25 ENCOUNTER — Ambulatory Visit: Payer: BC Managed Care – PPO

## 2013-04-30 ENCOUNTER — Ambulatory Visit
Admission: RE | Admit: 2013-04-30 | Discharge: 2013-04-30 | Disposition: A | Discharge status: Home / Self Care | Payer: 59 | Source: Ambulatory Visit

## 2013-04-30 DIAGNOSIS — Z1231 Encounter for screening mammogram for malignant neoplasm of breast: Secondary | ICD-10-CM

## 2013-05-07 ENCOUNTER — Other Ambulatory Visit (HOSPITAL_COMMUNITY)
Admission: RE | Admit: 2013-05-07 | Discharge: 2013-05-07 | Disposition: A | Discharge status: Home / Self Care | Payer: 59 | Source: Ambulatory Visit | Attending: Gynecology | Admitting: Gynecology

## 2013-05-07 ENCOUNTER — Encounter: Payer: Self-pay | Admitting: Gynecology

## 2013-05-07 ENCOUNTER — Ambulatory Visit (INDEPENDENT_AMBULATORY_CARE_PROVIDER_SITE_OTHER): Payer: 59 | Admitting: Gynecology

## 2013-05-07 VITALS — BP 136/88 | Ht 64.25 in | Wt 151.0 lb

## 2013-05-07 DIAGNOSIS — Z1151 Encounter for screening for human papillomavirus (HPV): Secondary | ICD-10-CM | POA: Insufficient documentation

## 2013-05-07 DIAGNOSIS — I10 Essential (primary) hypertension: Secondary | ICD-10-CM | POA: Insufficient documentation

## 2013-05-07 DIAGNOSIS — Z860100 Personal history of colon polyps, unspecified: Secondary | ICD-10-CM | POA: Insufficient documentation

## 2013-05-07 DIAGNOSIS — Z79890 Hormone replacement therapy (postmenopausal): Secondary | ICD-10-CM

## 2013-05-07 DIAGNOSIS — N951 Menopausal and female climacteric states: Secondary | ICD-10-CM

## 2013-05-07 DIAGNOSIS — M8580 Other specified disorders of bone density and structure, unspecified site: Secondary | ICD-10-CM | POA: Insufficient documentation

## 2013-05-07 DIAGNOSIS — Z780 Asymptomatic menopausal state: Secondary | ICD-10-CM

## 2013-05-07 DIAGNOSIS — Z01419 Encounter for gynecological examination (general) (routine) without abnormal findings: Secondary | ICD-10-CM

## 2013-05-07 DIAGNOSIS — Z1159 Encounter for screening for other viral diseases: Secondary | ICD-10-CM

## 2013-05-07 DIAGNOSIS — M899 Disorder of bone, unspecified: Secondary | ICD-10-CM

## 2013-05-07 DIAGNOSIS — N952 Postmenopausal atrophic vaginitis: Secondary | ICD-10-CM

## 2013-05-07 MED ORDER — ESTROGENS, CONJUGATED 0.625 MG/GM VA CREA: 1.0000 g | TOPICAL_CREAM | VAGINAL | Status: DC

## 2013-05-07 NOTE — Patient Instructions (Addendum)
Tetanus, Diphtheria, Pertussis (Tdap) Vaccine What You Need to Know WHY GET VACCINATED? Tetanus, diphtheria and pertussis can be very serious diseases, even for adolescents and adults. Tdap vaccine can protect us from these diseases. TETANUS (Lockjaw) causes painful muscle tightening and stiffness, usually all over the body.  It can lead to tightening of muscles in the head and neck so you can't open your mouth, swallow, or sometimes even breathe. Tetanus kills about 1 out of 5 people who are infected. DIPHTHERIA can cause a thick coating to form in the back of the throat.  It can lead to breathing problems, paralysis, heart failure, and death. PERTUSSIS (Whooping Cough) causes severe coughing spells, which can cause difficulty breathing, vomiting and disturbed sleep.  It can also lead to weight loss, incontinence, and rib fractures. Up to 2 in 100 adolescents and 5 in 100 adults with pertussis are hospitalized or have complications, which could include pneumonia and death. These diseases are caused by bacteria. Diphtheria and pertussis are spread from person to person through coughing or sneezing. Tetanus enters the body through cuts, scratches, or wounds. Before vaccines, the [place] saw as many as 200,000 cases a year of diphtheria and pertussis, and hundreds of cases of tetanus. Since vaccination began, tetanus and diphtheria have dropped by about 99% and pertussis by about 80%. TDAP VACCINE Tdap vaccine can protect adolescents and adults from tetanus, diphtheria, and pertussis. One dose of Tdap is routinely given at age 11 or 12. People who did not get Tdap at that age should get it as soon as possible. Tdap is especially important for health care professionals and anyone having close contact with a baby younger than 12 months. Pregnant women should get a dose of Tdap during every pregnancy, to protect the newborn from pertussis. Infants are most at risk for severe, life-threatening  complications from pertussis. A similar vaccine, called Td, protects from tetanus and diphtheria, but not pertussis. A Td booster should be given every 10 years. Tdap may be given as one of these boosters if you have not already gotten a dose. Tdap may also be given after a severe cut or burn to prevent tetanus infection. Your doctor can give you more information. Tdap may safely be given at the same time as other vaccines. SOME PEOPLE SHOULD NOT GET THIS VACCINE  If you ever had a life-threatening allergic reaction after a dose of any tetanus, diphtheria, or pertussis containing vaccine, OR if you have a severe allergy to any part of this vaccine, you should not get Tdap. Tell your doctor if you have any severe allergies.  If you had a coma, or long or multiple seizures within 7 days after a childhood dose of DTP or DTaP, you should not get Tdap, unless a cause other than the vaccine was found. You can still get Td.  Talk to your doctor if you:  have epilepsy or another nervous system problem,  had severe pain or swelling after any vaccine containing diphtheria, tetanus or pertussis,  ever had Guillain-Barr Syndrome (GBS),  aren't feeling well on the day the shot is scheduled. RISKS OF A VACCINE REACTION With any medicine, including vaccines, there is a chance of side effects. These are usually mild and go away on their own, but serious reactions are also possible. Brief fainting spells can follow a vaccination, leading to injuries from falling. Sitting or lying down for about 15 minutes can help prevent these. Tell your doctor if you feel dizzy or light-headed, or   have vision changes or ringing in the ears. Mild problems following Tdap (Did not interfere with activities)  Pain where the shot was given (about 3 in 4 adolescents or 2 in 3 adults)  Redness or swelling where the shot was given (about 1 person in 5)  Mild fever of at least 100.6F (up to about 1 in 25 adolescents or 1 in  100 adults)  Headache (about 3 or 4 people in 10)  Tiredness (about 1 person in 3 or 4)  Nausea, vomiting, diarrhea, stomach ache (up to 1 in 4 adolescents or 1 in 10 adults)  Chills, body aches, sore joints, rash, swollen glands (uncommon) Moderate problems following Tdap (Interfered with activities, but did not require medical attention)  Pain where the shot was given (about 1 in 5 adolescents or 1 in 100 adults)  Redness or swelling where the shot was given (up to about 1 in 16 adolescents or 1 in 25 adults)  Fever over 102F (about 1 in 100 adolescents or 1 in 250 adults)  Headache (about 3 in 20 adolescents or 1 in 10 adults)  Nausea, vomiting, diarrhea, stomach ache (up to 1 or 3 people in 100)  Swelling of the entire arm where the shot was given (up to about 3 in 100). Severe problems following Tdap (Unable to perform usual activities, required medical attention)  Swelling, severe pain, bleeding and redness in the arm where the shot was given (rare). A severe allergic reaction could occur after any vaccine (estimated less than 1 in a million doses). WHAT IF THERE IS A SERIOUS REACTION? What should I look for?  Look for anything that concerns you, such as signs of a severe allergic reaction, very high fever, or behavior changes. Signs of a severe allergic reaction can include hives, swelling of the face and throat, difficulty breathing, a fast heartbeat, dizziness, and weakness. These would start a few minutes to a few hours after the vaccination. What should I do?  If you think it is a severe allergic reaction or other emergency that can't wait, call 9-1-1 or get the person to the nearest hospital. Otherwise, call your doctor.  Afterward, the reaction should be reported to the "Vaccine Adverse Event Reporting System" (VAERS). Your doctor might file this report, or you can do it yourself through the VAERS web site at www.vaers.LAgents.no, or by calling 1-(445)097-1174. VAERS is  only for reporting reactions. They do not give medical advice.  THE NATIONAL VACCINE INJURY COMPENSATION PROGRAM The National Vaccine Injury Compensation Program (VICP) is a federal program that was created to compensate people who may have been injured by certain vaccines. Persons who believe they may have been injured by a vaccine can learn about the program and about filing a claim by calling 1-(941) 502-9069 or visiting the VICP website at SpiritualWord.at. HOW CAN I LEARN MORE?  Ask your doctor.  Call your local or state health department.  Contact the Centers for Disease Control and Prevention (CDC):  Call 409-875-6530 or visit CDC's website at PicCapture.uy. CDC Tdap Vaccine VIS (01/18/12) Document Released: 02/27/2012 Document Revised: 05/22/2012 Document Reviewed: 02/27/2012 ExitCare Patient Information 2014 [place], [place]. Shingles Vaccine What You Need to Know WHAT IS SHINGLES?  Shingles is a painful skin rash, often with blisters. It is also called Herpes Zoster or just Zoster.  A shingles rash usually appears on one side of the face or body and lasts from 2 to 4 weeks. Its main symptom is pain, which can be quite severe. Other  symptoms of shingles can include fever, headache, chills, and upset stomach. Very rarely, a shingles infection can lead to pneumonia, hearing problems, blindness, brain inflammation (encephalitis), or death.  For about 1 person in 5, severe pain can continue even after the rash clears up. This is called post-herpetic neuralgia.  Shingles is caused by the Varicella Zoster virus. This is the same virus that causes chickenpox. Only someone who has had a case of chickenpox or rarely, has gotten chickenpox vaccine, can get shingles. The virus stays in your body. It can reappear many years later to cause a case of shingles.  You cannot catch shingles from another person with shingles. However, a person who has never had chickenpox (or  chickenpox vaccine) could get chickenpox from someone with shingles. This is not very common.  Shingles is far more common in people 29 and older than in younger people. It is also more common in people whose immune systems are weakened because of a disease such as cancer or drugs such as steroids or chemotherapy.  At least 1 million people get shingles per year in the [place]. SHINGLES VACCINE  A vaccine for shingles was licensed in 2006. In clinical trials, the vaccine reduced the risk of shingles by 50%. It can also reduce the pain in people who still get shingles after being vaccinated.  A single dose of shingles vaccine is recommended for adults 80 years of age and older. SOME PEOPLE SHOULD NOT GET SHINGLES VACCINE OR SHOULD WAIT A person should not get shingles vaccine if he or she:  Has ever had a life-threatening allergic reaction to gelatin, the antibiotic neomycin, or any other component of shingles vaccine. Tell your caregiver if you have any severe allergies.  Has a weakened immune system because of current:  AIDS or another disease that affects the immune system.  Treatment with drugs that affect the immune system, such as prolonged use of high-dose steroids.  Cancer treatment, such as radiation or chemotherapy.  Cancer affecting the bone marrow or lymphatic system, such as leukemia or lymphoma.  Is pregnant, or might be pregnant. Women should not become pregnant until at least 4 weeks after getting shingles vaccine. Someone with a minor illness, such as a cold, may be vaccinated. Anyone with a moderate or severe acute illness should usually wait until he or she recovers before getting the vaccine. This includes anyone with a temperature of 101.3 F (38 C) or higher. WHAT ARE THE RISKS FROM SHINGLES VACCINE?  A vaccine, like any medicine, could possibly cause serious problems, such as severe allergic reactions. However, the risk of a vaccine causing serious harm, or  death, is extremely small.  No serious problems have been identified with shingles vaccine. Mild Problems  Redness, soreness, swelling, or itching at the site of the injection (about 1 person in 3).  Headache (about 1 person in 70). Like all vaccines, shingles vaccine is being closely monitored for unusual or severe problems. WHAT IF THERE IS A MODERATE OR SEVERE REACTION? What should I look for? Any unusual condition, such as a severe allergic reaction or a high fever. If a severe allergic reaction occurred, it would be within a few minutes to an hour after the shot. Signs of a serious allergic reaction can include difficulty breathing, weakness, hoarseness or wheezing, a fast heartbeat, hives, dizziness, paleness, or swelling of the throat. What should I do?  Call your caregiver, or get the person to a caregiver right away.  Tell the caregiver  what happened, the date and time it happened, and when the vaccination was given.  Ask the caregiver to report the reaction by filing a Vaccine Adverse Event Reporting System (VAERS) form. Or, you can file this report through the VAERS web site at www.vaers.LAgents.no or by calling 1-(604)211-2284. VAERS does not provide medical advice. HOW CAN I LEARN MORE?  Ask your caregiver. He or she can give you the vaccine package insert or suggest other sources of information.  Contact the Centers for Disease Control and Prevention (CDC):  Call (917)457-0045 (1-800-CDC-INFO).  Visit the CDC website at PicCapture.uy CDC Shingles Vaccine VIS (06/16/08) Document Released: 06/25/2006 Document Revised: 11/20/2011 Document Reviewed: 06/16/2008 ExitCare Patient Information 2014 [place], [place]. Bone Densitometry Bone densitometry is a special X-ray that measures your bone density and can be used to help predict your risk of bone fractures. This test is used to determine bone mineral content and density to diagnose osteoporosis. Osteoporosis is the loss of  bone that may cause the bone to become weak. Osteoporosis commonly occurs in women entering menopause. However, it may be found in men and in people with other diseases. PREPARATION FOR TEST No preparation necessary. WHO SHOULD BE TESTED?  All women older than 32.  Postmenopausal women (50 to 13) with risk factors for osteoporosis.  People with a previous fracture caused by normal activities.  People with a small body frame (less than 127 poundsor a body mass index [BMI] of less than 21).  People who have a parent with a hip fracture or history of osteoporosis.  People who smoke.  People who have rheumatoid arthritis.  Anyone who engages in excessive alcohol use (more than 3 drinks most days).  Women who experience early menopause. WHEN SHOULD YOU BE RETESTED? Current guidelines suggest that you should wait at least 2 years before doing a bone density test again if your first test was normal.Recent studies indicated that women with normal bone density may be able to wait a few years before needing to repeat a bone density test. You should discuss this with your caregiver.  NORMAL FINDINGS   Normal: less than standard deviation below normal (greater than -1).  Osteopenia: 1 to 2.5 standard deviations below normal (-1 to -2.5).  Osteoporosis: greater than 2.5 standard deviations below normal (less than -2.5). Test results are reported as a "T score" and a "Z score."The T score is a number that compares your bone density with the bone density of healthy, young women.The Z score is a number that compares your bone density with the scores of women who are the same age, gender, and race.  Ranges for normal findings may vary among different laboratories and hospitals. You should always check with your doctor after having lab work or other tests done to discuss the meaning of your test results and whether your values are considered within normal limits. MEANING OF TEST  Your caregiver  will go over the test results with you and discuss the importance and meaning of your results, as well as treatment options and the need for additional tests if necessary. OBTAINING THE TEST RESULTS It is your responsibility to obtain your test results. Ask the lab or department performing the test when and how you will get your results. Document Released: 09/19/2004 Document Revised: 11/20/2011 Document Reviewed: 10/12/2010 [facility] Patient Information 2014 [place], [place].

## 2013-05-07 NOTE — Addendum Note (Signed)
Addended by: Bertram Savin A on: 05/07/2013 10:21 AM   Modules accepted: Orders

## 2013-05-07 NOTE — Progress Notes (Signed)
Bunny Kleist 03/14/1951 [id number]   History:    62 y.o.  New patient to the practice who was previously been followed by Dr. Beather Arbour. Patient with no complaints today. Her primary physician is Dr. Sigmund Hazel who has been following the patient for hypertension and when her lab work. Review of her records that she brought with her as follows:  Patient has prior history of transvaginal hysterectomy with left salpingo-oophorectomy and endometriosis for atypical glandular cells in 1991. Subsequent Pap smears have been normal.  History of right breast biopsy pathology report fibroadenoma recent mammogram this month and normal breast although dense.  Past history of benign colon polyps. Last colonoscopy 2012.  History of osteopenia whereby her [place] T score right femoral neck -1.2 there was no Frax analysis done that I could see on previous reports.  Patient suffers from vaginal atrophy and is on Premarin vaginal cream twice a week.  Past medical history,surgical history, family history and social history were all reviewed and documented in the EPIC chart.  Gynecologic History No LMP recorded. Patient is postmenopausal. Contraception: status post hysterectomy Last Pap: 2013. Results were: normal Last mammogram: 2014. Results were: normal but dense  Obstetric History OB History  Gravida Para Term Preterm AB SAB TAB Ectopic Multiple Living  2 2        2     # Outcome Date GA Lbr Len/2nd Weight Sex Delivery Anes PTL Lv  2 PAR           1 PAR                ROS: A ROS was performed and pertinent positives and negatives are included in the history.  GENERAL: No fevers or chills. HEENT: No change in vision, no earache, sore throat or sinus congestion. NECK: No pain or stiffness. CARDIOVASCULAR: No chest pain or pressure. No palpitations. PULMONARY: No shortness of breath, cough or wheeze. GASTROINTESTINAL: No abdominal pain, nausea, vomiting or diarrhea, melena or bright red blood per  rectum. GENITOURINARY: No urinary frequency, urgency, hesitancy or dysuria. MUSCULOSKELETAL: No joint or muscle pain, no back pain, no recent trauma. DERMATOLOGIC: No rash, no itching, no lesions. ENDOCRINE: No polyuria, polydipsia, no heat or cold intolerance. No recent change in weight. HEMATOLOGICAL: No anemia or easy bruising or bleeding. NEUROLOGIC: No headache, seizures, numbness, tingling or weakness. PSYCHIATRIC: No depression, no loss of interest in normal activity or change in sleep pattern.     Exam: chaperone present  BP 136/88  Ht 5' 4.25" (1.632 m)  Wt 151 lb (68.493 kg)  BMI 25.72 kg/m2  Body mass index is 25.72 kg/(m^2).  General appearance : Well developed well nourished female. No acute distress HEENT: Neck supple, trachea midline, no carotid bruits, no thyroidmegaly Lungs: Clear to auscultation, no rhonchi or wheezes, or rib retractions  Heart: Regular rate and rhythm, no murmurs or gallops Breast:Examined in sitting and supine position were symmetrical in appearance, no palpable masses or tenderness,  no skin retraction, no nipple inversion, no nipple discharge, no skin discoloration, no axillary or supraclavicular lymphadenopathy Abdomen: no palpable masses or tenderness, no rebound or guarding Extremities: no edema or skin discoloration or tenderness  Pelvic:  Bartholin, Urethra, Skene Glands: Within normal limits             Vagina: No gross lesions or discharge  Cervix:absent  Uterus  Absent  Adnexa  Without masses or tenderness  Anus and perineum  normal   Rectovaginal  normal sphincter tone without palpated  masses or tenderness             Hemoccult Card provided     Assessment/Plan:  62 y.o. female with only menopausal symptoms consisting of vaginal atrophy which has improved with Premarin vaginal cream twice a week. Patient will be scheduling in our office a bone density study in the next few weeks. Blood work will be drawn by her primary physician which  she is scheduled to see in October. She was provided with Hemoccult cards to submit to the office for testing. We discussed the importance of calcium and vitamin D for osteoporosis prevention along with regular exercise program. Patient states that her Tdap vaccine is up-to-date and she will be receiving her shingles vaccine at her primary doctor's office soon.  New CDC guidelines is recommending patients be tested once in her lifetime for hepatitis C antibody who were born between [birth year] through 1965. This was discussed with the patient today and has agreed to be tested today.  Despite the new guidelines since patient has had a history of atypical glandular cells prior to her hysterectomy she will have a Pap smear yearly   Ok Edwards MD, 9:06 AM 05/07/2013

## 2013-05-09 ENCOUNTER — Telehealth: Payer: Self-pay

## 2013-05-09 NOTE — Telephone Encounter (Signed)
Was not provided stool kit today. Asked if I could leave it at front desk and she will pick it up Weds when in [place].  Patient informed it will be there for her to pick up. Apology offered.

## 2013-05-20 ENCOUNTER — Encounter: Payer: Self-pay | Admitting: Gynecology

## 2013-05-30 ENCOUNTER — Other Ambulatory Visit: Payer: 59 | Admitting: Anesthesiology

## 2013-05-30 DIAGNOSIS — Z1211 Encounter for screening for malignant neoplasm of colon: Secondary | ICD-10-CM

## 2013-06-24 ENCOUNTER — Ambulatory Visit (INDEPENDENT_AMBULATORY_CARE_PROVIDER_SITE_OTHER): Payer: 59

## 2013-06-24 DIAGNOSIS — M899 Disorder of bone, unspecified: Secondary | ICD-10-CM

## 2013-06-24 DIAGNOSIS — M8580 Other specified disorders of bone density and structure, unspecified site: Secondary | ICD-10-CM

## 2013-07-17 ENCOUNTER — Other Ambulatory Visit: Payer: Self-pay

## 2014-01-29 ENCOUNTER — Other Ambulatory Visit: Payer: Self-pay

## 2014-01-29 DIAGNOSIS — Z1231 Encounter for screening mammogram for malignant neoplasm of breast: Secondary | ICD-10-CM

## 2014-05-01 ENCOUNTER — Ambulatory Visit
Admission: RE | Admit: 2014-05-01 | Discharge: 2014-05-01 | Disposition: A | Discharge status: Home / Self Care | Payer: 59 | Source: Ambulatory Visit

## 2014-05-01 DIAGNOSIS — Z1231 Encounter for screening mammogram for malignant neoplasm of breast: Secondary | ICD-10-CM

## 2014-05-12 ENCOUNTER — Encounter: Payer: 59 | Admitting: Gynecology

## 2014-05-13 ENCOUNTER — Encounter: Payer: Self-pay | Admitting: Gynecology

## 2014-05-13 ENCOUNTER — Other Ambulatory Visit (HOSPITAL_COMMUNITY)
Admission: RE | Admit: 2014-05-13 | Discharge: 2014-05-13 | Disposition: A | Discharge status: Home / Self Care | Payer: 59 | Source: Ambulatory Visit | Attending: Gynecology | Admitting: Gynecology

## 2014-05-13 ENCOUNTER — Ambulatory Visit (INDEPENDENT_AMBULATORY_CARE_PROVIDER_SITE_OTHER): Payer: 59 | Admitting: Gynecology

## 2014-05-13 ENCOUNTER — Telehealth: Payer: Self-pay | Admitting: *Deleted

## 2014-05-13 VITALS — BP 146/82 | Ht 64.5 in | Wt 153.4 lb

## 2014-05-13 DIAGNOSIS — Z01419 Encounter for gynecological examination (general) (routine) without abnormal findings: Secondary | ICD-10-CM

## 2014-05-13 DIAGNOSIS — M949 Disorder of cartilage, unspecified: Secondary | ICD-10-CM

## 2014-05-13 DIAGNOSIS — Z79890 Hormone replacement therapy (postmenopausal): Secondary | ICD-10-CM

## 2014-05-13 DIAGNOSIS — Z87410 Personal history of cervical dysplasia: Secondary | ICD-10-CM

## 2014-05-13 DIAGNOSIS — M8580 Other specified disorders of bone density and structure, unspecified site: Secondary | ICD-10-CM

## 2014-05-13 DIAGNOSIS — N952 Postmenopausal atrophic vaginitis: Secondary | ICD-10-CM

## 2014-05-13 DIAGNOSIS — M899 Disorder of bone, unspecified: Secondary | ICD-10-CM

## 2014-05-13 MED ORDER — ESTROGENS, CONJUGATED 0.625 MG/GM VA CREA: 1.0000 g | TOPICAL_CREAM | VAGINAL | Status: DC

## 2014-05-13 NOTE — Telephone Encounter (Signed)
Pt never received her hemocult card from [facility], I will put one in mail for pt.

## 2014-05-13 NOTE — Progress Notes (Signed)
Katherine Fisher Jul 17, 1951 [id number]   History:    63 y.o.   presents to the office for her annual gynecological exam. Patient was seen last year in our office for the first time as a new patient. Patient was previously been followed by Dr. Ubaldo Glassing. Her primary physicians Dr. Kathyrn Lass who has been doing her blood work and follow her for hypertension. Review of her records had indicated the following:  Patient has prior history of transvaginal hysterectomy with left salpingo-oophorectomy and endometriosis for atypical glandular cells in 1991. Subsequent Pap smears have been normal  History of right breast biopsy pathology report fibroadenoma recent mammogram this month and normal breast although dense.  Past history of benign colon polyps. Last colonoscopy 2012.  Last bone density study done here in the office in 2014 indicating that her lowest T score on both right and left femoral neck was -1.5 with normal  FRAX analysis.   Patient suffers from vaginal atrophy and is on Premarin vaginal cream twice a week.    Past medical history,surgical history, family history and social history were all reviewed and documented in the EPIC chart.  Gynecologic History No LMP recorded. Patient is postmenopausal. Contraception: status post hysterectomy Last Pap: 2014 . Results were: normal Last mammogram: 2015 . Results were: but dense three-dimensional mammogram done   Obstetric History OB History  Gravida Para Term Preterm AB SAB TAB Ectopic Multiple Living  2 2        2     # Outcome Date GA Lbr Len/2nd Weight Sex Delivery Anes PTL Lv  2 PAR           1 PAR                ROS: A ROS was performed and pertinent positives and negatives are included in the history.  GENERAL: No fevers or chills. HEENT: No change in vision, no earache, sore throat or sinus congestion. NECK: No pain or stiffness. CARDIOVASCULAR: No chest pain or pressure. No palpitations. PULMONARY: No shortness of breath, cough  or wheeze. GASTROINTESTINAL: No abdominal pain, nausea, vomiting or diarrhea, melena or bright red blood per rectum. GENITOURINARY: No urinary frequency, urgency, hesitancy or dysuria. MUSCULOSKELETAL: No joint or muscle pain, no back pain, no recent trauma. DERMATOLOGIC: No rash, no itching, no lesions. ENDOCRINE: No polyuria, polydipsia, no heat or cold intolerance. No recent change in weight. HEMATOLOGICAL: No anemia or easy bruising or bleeding. NEUROLOGIC: No headache, seizures, numbness, tingling or weakness. PSYCHIATRIC: No depression, no loss of interest in normal activity or change in sleep pattern.     Exam: chaperone present  BP 146/82  Ht 5' 4.5" (1.638 m)  Wt 153 lb 6.4 oz (69.582 kg)  BMI 25.93 kg/m2  Body mass index is 25.93 kg/(m^2).  General appearance : Well developed well nourished female. No acute distress HEENT: Neck supple, trachea midline, no carotid bruits, no thyroidmegaly Lungs: Clear to auscultation, no rhonchi or wheezes, or rib retractions  Heart: Regular rate and rhythm, no murmurs or gallops Breast:Examined in sitting and supine position were symmetrical in appearance, no palpable masses or tenderness,  no skin retraction, no nipple inversion, no nipple discharge, no skin discoloration, no axillary or supraclavicular lymphadenopathy Abdomen: no palpable masses or tenderness, no rebound or guarding Extremities: no edema or skin discoloration or tenderness  Pelvic:  Bartholin, Urethra, Skene Glands: Within normal limits             Vagina: No gross lesions or discharge  Cervix: absent   Uterus  absent   Adnexa  Without masses or tenderness  Anus and perineum  normal   Rectovaginal  normal sphincter tone without palpated masses or tenderness             Hemoccult cards provided    Assessment/Plan:  63 y.o. female for annual exam doing well with Premarin vaginal cream twice a week for vaginal atrophy. PCP will be doing her blood work. Patient was reminded  of the importance of calcium vitamin D and regular exercise for osteoporosis prevention. We discussed the importance of monthly self breast exams. She was reminded to submit to the office the fecal Hemoccult card for testing. Pap smear was done today.   Note: This dictation was prepared with  Dragon/digital dictation along withSmart phrase technology. Any transcriptional errors that result from this process are unintentional.   Terrance Mass MD, 9:01 AM 05/13/2014

## 2014-05-14 LAB — CYTOLOGY - PAP

## 2014-05-26 ENCOUNTER — Other Ambulatory Visit: Payer: 59 | Admitting: Anesthesiology

## 2014-05-26 DIAGNOSIS — Z1211 Encounter for screening for malignant neoplasm of colon: Secondary | ICD-10-CM

## 2014-07-13 ENCOUNTER — Encounter: Payer: Self-pay | Admitting: Gynecology

## 2015-02-15 ENCOUNTER — Other Ambulatory Visit: Payer: Self-pay

## 2015-02-15 DIAGNOSIS — Z1231 Encounter for screening mammogram for malignant neoplasm of breast: Secondary | ICD-10-CM

## 2015-05-04 ENCOUNTER — Ambulatory Visit
Admission: RE | Admit: 2015-05-04 | Discharge: 2015-05-04 | Disposition: A | Discharge status: Home / Self Care | Payer: 59 | Source: Ambulatory Visit

## 2015-05-04 DIAGNOSIS — Z1231 Encounter for screening mammogram for malignant neoplasm of breast: Secondary | ICD-10-CM

## 2015-05-05 ENCOUNTER — Other Ambulatory Visit: Payer: Self-pay | Admitting: Gynecology

## 2015-05-05 DIAGNOSIS — R928 Other abnormal and inconclusive findings on diagnostic imaging of breast: Secondary | ICD-10-CM

## 2015-05-06 ENCOUNTER — Ambulatory Visit
Admission: RE | Admit: 2015-05-06 | Discharge: 2015-05-06 | Disposition: A | Discharge status: Home / Self Care | Payer: 59 | Source: Ambulatory Visit | Attending: Gynecology | Admitting: Gynecology

## 2015-05-06 DIAGNOSIS — R928 Other abnormal and inconclusive findings on diagnostic imaging of breast: Secondary | ICD-10-CM

## 2015-05-19 ENCOUNTER — Other Ambulatory Visit (HOSPITAL_COMMUNITY)
Admission: RE | Admit: 2015-05-19 | Discharge: 2015-05-19 | Disposition: A | Discharge status: Home / Self Care | Payer: 59 | Source: Ambulatory Visit | Attending: Gynecology | Admitting: Gynecology

## 2015-05-19 ENCOUNTER — Ambulatory Visit (INDEPENDENT_AMBULATORY_CARE_PROVIDER_SITE_OTHER): Payer: 59 | Admitting: Gynecology

## 2015-05-19 ENCOUNTER — Encounter: Payer: Self-pay | Admitting: Gynecology

## 2015-05-19 VITALS — BP 130/88 | Ht 64.5 in | Wt 146.0 lb

## 2015-05-19 DIAGNOSIS — Z79890 Hormone replacement therapy (postmenopausal): Secondary | ICD-10-CM | POA: Diagnosis not present

## 2015-05-19 DIAGNOSIS — N952 Postmenopausal atrophic vaginitis: Secondary | ICD-10-CM

## 2015-05-19 DIAGNOSIS — M8580 Other specified disorders of bone density and structure, unspecified site: Secondary | ICD-10-CM

## 2015-05-19 DIAGNOSIS — Z01419 Encounter for gynecological examination (general) (routine) without abnormal findings: Secondary | ICD-10-CM | POA: Insufficient documentation

## 2015-05-19 MED ORDER — ESTROGENS, CONJUGATED 0.625 MG/GM VA CREA: 1.0000 g | TOPICAL_CREAM | VAGINAL | Status: DC

## 2015-05-19 NOTE — Addendum Note (Signed)
Addended by: Thurnell Garbe A on: 05/19/2015 09:10 AM   Modules accepted: Miquel Dunn

## 2015-05-19 NOTE — Progress Notes (Signed)
Katherine Fisher [birth year] [id number]   History:    64 y.o.  for annual gyn exam with no complaints today.Patient was previously been followed by Dr. Ubaldo Glassing. Her primary physicians Dr. Kathyrn Lass who has been doing her blood work and follow her for hypertension. Review of her records had indicated the following:  Patient has prior history of transvaginal hysterectomy with left salpingo-oophorectomy and endometriosis for atypical glandular cells in 1991. Subsequent Pap smears have been normal  History of right breast biopsy pathology report fibroadenoma recent mammogram this month and normal breast although dense.  Past history of benign colon polyps. Last colonoscopy 2012.  Last bone density study done here in the office in 2014 indicating that her lowest T score on both right and left femoral neck was -1.5 with normal FRAX analysis.   Patient suffers from vaginal atrophy and is on Premarin vaginal cream twice a week.    Past medical history,surgical history, family history and social history were all reviewed and documented in the EPIC chart.  Gynecologic History No LMP recorded. Patient has had a hysterectomy. Contraception: status post hysterectomy Last Pap: 2012. Results were: normal Last mammogram: 2016. Results were: normal  Obstetric History OB History  Gravida Para Term Preterm AB SAB TAB Ectopic Multiple Living  2 2        2     # Outcome Date GA Lbr Len/2nd Weight Sex Delivery Anes PTL Lv  2 Para     F CS-Unspec     1 Para     M CS-Unspec          ROS: A ROS was performed and pertinent positives and negatives are included in the history.  GENERAL: No fevers or chills. HEENT: No change in vision, no earache, sore throat or sinus congestion. NECK: No pain or stiffness. CARDIOVASCULAR: No chest pain or pressure. No palpitations. PULMONARY: No shortness of breath, cough or wheeze. GASTROINTESTINAL: No abdominal pain, nausea, vomiting or diarrhea, melena or bright red  blood per rectum. GENITOURINARY: No urinary frequency, urgency, hesitancy or dysuria. MUSCULOSKELETAL: No joint or muscle pain, no back pain, no recent trauma. DERMATOLOGIC: No rash, no itching, no lesions. ENDOCRINE: No polyuria, polydipsia, no heat or cold intolerance. No recent change in weight. HEMATOLOGICAL: No anemia or easy bruising or bleeding. NEUROLOGIC: No headache, seizures, numbness, tingling or weakness. PSYCHIATRIC: No depression, no loss of interest in normal activity or change in sleep pattern.     Exam: chaperone present  BP 130/88 mmHg  Ht 5' 4.5" (1.638 m)  Wt 146 lb (66.225 kg)  BMI 24.68 kg/m2  Body mass index is 24.68 kg/(m^2).  General appearance : Well developed well nourished female. No acute distress HEENT: Eyes: no retinal hemorrhage or exudates,  Neck supple, trachea midline, no carotid bruits, no thyroidmegaly Lungs: Clear to auscultation, no rhonchi or wheezes, or rib retractions  Heart: Regular rate and rhythm, no murmurs or gallops Breast:Examined in sitting and supine position were symmetrical in appearance, no palpable masses or tenderness,  no skin retraction, no nipple inversion, no nipple discharge, no skin discoloration, no axillary or supraclavicular lymphadenopathy Abdomen: no palpable masses or tenderness, no rebound or guarding Extremities: no edema or skin discoloration or tenderness  Pelvic:  Bartholin, Urethra, Skene Glands: Within normal limits             Vagina: No gross lesions or discharge  Cervix: Absent  Uterus absent  Adnexa  Without masses or tenderness  Anus and perineum  normal  Rectovaginal  normal sphincter tone without palpated masses or tenderness             Hemoccult cards with provided     Assessment/Plan:  64 y.o. female for annual exam doing well on Premarin vaginal cream twice a week for vaginal atrophy. Patient with history of osteopenia but normal Frax analysis in 2014. Patient to schedule bone density study end of  October. We discussed importance of calcium and vitamin D and weightbearing exercises for osteoporosis prevention. Pap smear was done today. PCP we'll be doing her blood work. Vaccinations up-to-date.   Terrance Mass MD, 9:05 AM 05/19/2015

## 2015-05-19 NOTE — Patient Instructions (Signed)

## 2015-05-20 LAB — CYTOLOGY - PAP

## 2015-06-29 ENCOUNTER — Ambulatory Visit (INDEPENDENT_AMBULATORY_CARE_PROVIDER_SITE_OTHER): Payer: 59

## 2015-06-29 ENCOUNTER — Other Ambulatory Visit: Payer: Self-pay | Admitting: Gynecology

## 2015-06-29 DIAGNOSIS — M8589 Other specified disorders of bone density and structure, multiple sites: Secondary | ICD-10-CM

## 2015-06-29 DIAGNOSIS — M8580 Other specified disorders of bone density and structure, unspecified site: Secondary | ICD-10-CM

## 2015-06-29 DIAGNOSIS — M899 Disorder of bone, unspecified: Secondary | ICD-10-CM

## 2015-06-29 DIAGNOSIS — Z13828 Encounter for screening for other musculoskeletal disorder: Secondary | ICD-10-CM

## 2015-07-14 ENCOUNTER — Encounter: Payer: Self-pay | Admitting: Gynecology

## 2015-07-20 ENCOUNTER — Other Ambulatory Visit: Payer: 59 | Admitting: Anesthesiology

## 2015-07-20 DIAGNOSIS — Z1211 Encounter for screening for malignant neoplasm of colon: Secondary | ICD-10-CM | POA: Diagnosis not present

## 2016-03-24 ENCOUNTER — Other Ambulatory Visit: Payer: Self-pay | Admitting: Family Medicine

## 2016-03-24 DIAGNOSIS — Z1231 Encounter for screening mammogram for malignant neoplasm of breast: Secondary | ICD-10-CM

## 2016-05-16 ENCOUNTER — Ambulatory Visit: Payer: 59

## 2016-05-18 ENCOUNTER — Ambulatory Visit
Admission: RE | Admit: 2016-05-18 | Discharge: 2016-05-18 | Disposition: A | Discharge status: Home / Self Care | Payer: MEDICARE | Source: Ambulatory Visit | Attending: Family Medicine | Admitting: Family Medicine

## 2016-05-18 DIAGNOSIS — Z1231 Encounter for screening mammogram for malignant neoplasm of breast: Secondary | ICD-10-CM

## 2016-05-19 ENCOUNTER — Encounter: Payer: Self-pay | Admitting: Gynecology

## 2016-05-19 ENCOUNTER — Ambulatory Visit (INDEPENDENT_AMBULATORY_CARE_PROVIDER_SITE_OTHER): Payer: MEDICARE | Admitting: Gynecology

## 2016-05-19 VITALS — BP 128/84 | Ht 64.0 in | Wt 149.0 lb

## 2016-05-19 DIAGNOSIS — Z01419 Encounter for gynecological examination (general) (routine) without abnormal findings: Secondary | ICD-10-CM | POA: Diagnosis not present

## 2016-05-19 DIAGNOSIS — Z780 Asymptomatic menopausal state: Secondary | ICD-10-CM

## 2016-05-19 DIAGNOSIS — Z1272 Encounter for screening for malignant neoplasm of vagina: Secondary | ICD-10-CM

## 2016-05-19 DIAGNOSIS — N952 Postmenopausal atrophic vaginitis: Secondary | ICD-10-CM | POA: Diagnosis not present

## 2016-05-19 DIAGNOSIS — Z79890 Hormone replacement therapy (postmenopausal): Secondary | ICD-10-CM | POA: Diagnosis not present

## 2016-05-19 DIAGNOSIS — M8580 Other specified disorders of bone density and structure, unspecified site: Secondary | ICD-10-CM | POA: Diagnosis not present

## 2016-05-19 DIAGNOSIS — Z87410 Personal history of cervical dysplasia: Secondary | ICD-10-CM

## 2016-05-19 MED ORDER — ESTROGENS, CONJUGATED 0.625 MG/GM VA CREA: 1.0000 g | TOPICAL_CREAM | VAGINAL | 10 refills | Status: DC

## 2016-05-19 NOTE — Patient Instructions (Signed)

## 2016-05-19 NOTE — Addendum Note (Signed)
Addended by: Thurnell Garbe A on: 05/19/2016 09:26 AM   Modules accepted: Orders

## 2016-05-19 NOTE — Progress Notes (Signed)
Kristee Holness May 06, 1951 [id number]   History:    65 y.o.  for annual exam who has had issues in the past of vaginal dryness as a result of her vaginal atrophy since she was started on vaginal estrogen twice a week it has helped her tremendously.Patient has prior history of transvaginal hysterectomy with left salpingo-oophorectomy and endometriosis for atypical glandular cells in 1991. Subsequent Pap smears have been normal review of patient's record indicated she had benign colon polyps on her colonoscopy in 2012 and recently which she went to see the gastroenterologist. Informed that she does not need a colonoscopy for another 5 years.  Patient's last bone density study was in 2016 the lowest T score was at the right femoral neck with a value of -1.5 in the osteopenic range was statistically significant decrease in bone mineralization we compare with 2014. The other regions of interest with no significant change. She also had normal Frax analysis. She is currently taking calcium and vitamin D on a regular basis.  Dr. Sanjuan Dame her PCP that has been doing her blood work and she scheduled in October.   Past medical history,surgical history, family history and social history were all reviewed and documented in the EPIC chart.  Gynecologic History No LMP recorded. Patient has had a hysterectomy. Contraception: status post hysterectomy Last Pap: 2012 through 2016. Results were: normal Last mammogram: 2017. Results were: Normal but dense had three-dimensional mammogram  Obstetric History OB History  Gravida Para Term Preterm AB Living  2 2       2   SAB TAB Ectopic Multiple Live Births               # Outcome Date GA Lbr Len/2nd Weight Sex Delivery Anes PTL Lv  2 Para     F CS-Unspec     1 Para     M CS-Unspec          ROS: A ROS was performed and pertinent positives and negatives are included in the history.  GENERAL: No fevers or chills. HEENT: No change in vision, no earache, sore  throat or sinus congestion. NECK: No pain or stiffness. CARDIOVASCULAR: No chest pain or pressure. No palpitations. PULMONARY: No shortness of breath, cough or wheeze. GASTROINTESTINAL: No abdominal pain, nausea, vomiting or diarrhea, melena or bright red blood per rectum. GENITOURINARY: No urinary frequency, urgency, hesitancy or dysuria. MUSCULOSKELETAL: No joint or muscle pain, no back pain, no recent trauma. DERMATOLOGIC: No rash, no itching, no lesions. ENDOCRINE: No polyuria, polydipsia, no heat or cold intolerance. No recent change in weight. HEMATOLOGICAL: No anemia or easy bruising or bleeding. NEUROLOGIC: No headache, seizures, numbness, tingling or weakness. PSYCHIATRIC: No depression, no loss of interest in normal activity or change in sleep pattern.     Exam: chaperone present  BP 128/84   Ht 5\' 4"  (1.626 m)   Wt 149 lb (67.6 kg)   BMI 25.58 kg/m   Body mass index is 25.58 kg/m.  General appearance : Well developed well nourished female. No acute distress HEENT: Eyes: no retinal hemorrhage or exudates,  Neck supple, trachea midline, no carotid bruits, no thyroidmegaly Lungs: Clear to auscultation, no rhonchi or wheezes, or rib retractions  Heart: Regular rate and rhythm, no murmurs or gallops Breast:Examined in sitting and supine position were symmetrical in appearance, no palpable masses or tenderness,  no skin retraction, no nipple inversion, no nipple discharge, no skin discoloration, no axillary or supraclavicular lymphadenopathy Abdomen: no palpable masses or tenderness, no  rebound or guarding Extremities: no edema or skin discoloration or tenderness  Pelvic:  Bartholin, Urethra, Skene Glands: Within normal limits             Vagina: No gross lesions or discharge atrophic changes  Cervix: Absent  Uterus  absent  Adnexa  Without masses or tenderness  Anus and perineum  normal   Rectovaginal  normal sphincter tone without palpated masses or tenderness              Hemoccult PCP provides     Assessment/Plan:  65 y.o. female for annual exam with past history of atypical Pap smear she has been followed with annual Pap smears since 2012 been normal. We will do one last Pap smear today and then adhere to the new guidelines. Also patient with vaginal atrophy once again she was given a prescription refill for the Premarin vaginal cream to apply twice a week. The risks benefits and pros and cons were discussed. Patient with history of osteopenia we reviewed all her bone density studies and reviewed her Frax analysis and discussed her risk of fracture. We also discussed importance of calcium vitamin D and weightbearing exercises for osteoporosis prevention. She will need a bone density study next year. Her PCP has been doing her blood work which is scheduled for October and she'll get her flu vaccine at that time. Her shingles vaccine is up-to-date as well.  An additional 10 minutes was spent looking over her past bone density studies and discussing the risk of osteoporosis and postmenopausal patient's as well as the risks benefits and pros and cons of hormone replacement therapy   Terrance Mass MD, 9:05 AM 05/19/2016

## 2016-05-22 LAB — PAP IG W/ RFLX HPV ASCU

## 2016-06-12 ENCOUNTER — Telehealth: Payer: Self-pay | Admitting: *Deleted

## 2016-06-12 MED ORDER — ESTROGENS, CONJUGATED 0.625 MG/GM VA CREA
TOPICAL_CREAM | VAGINAL | 3 refills | Status: DC
Start: 2016-06-12 — End: 2017-05-22

## 2016-06-12 NOTE — Telephone Encounter (Signed)
Pt called has mail order pharmacy [facility] mail order, needs premarin vaginal cream.

## 2016-07-07 DIAGNOSIS — Z0000 Encounter for general adult medical examination without abnormal findings: Secondary | ICD-10-CM | POA: Diagnosis not present

## 2016-07-07 DIAGNOSIS — D649 Anemia, unspecified: Secondary | ICD-10-CM | POA: Diagnosis not present

## 2016-07-07 DIAGNOSIS — I10 Essential (primary) hypertension: Secondary | ICD-10-CM | POA: Diagnosis not present

## 2016-07-07 DIAGNOSIS — Z1159 Encounter for screening for other viral diseases: Secondary | ICD-10-CM | POA: Diagnosis not present

## 2016-07-11 DIAGNOSIS — I10 Essential (primary) hypertension: Secondary | ICD-10-CM | POA: Diagnosis not present

## 2016-07-11 DIAGNOSIS — M859 Disorder of bone density and structure, unspecified: Secondary | ICD-10-CM | POA: Diagnosis not present

## 2016-07-11 DIAGNOSIS — Z23 Encounter for immunization: Secondary | ICD-10-CM | POA: Diagnosis not present

## 2016-07-11 DIAGNOSIS — Z0000 Encounter for general adult medical examination without abnormal findings: Secondary | ICD-10-CM | POA: Diagnosis not present

## 2016-07-11 DIAGNOSIS — J309 Allergic rhinitis, unspecified: Secondary | ICD-10-CM | POA: Diagnosis not present

## 2016-07-11 DIAGNOSIS — Z86010 Personal history of colonic polyps: Secondary | ICD-10-CM | POA: Diagnosis not present

## 2016-07-11 DIAGNOSIS — D649 Anemia, unspecified: Secondary | ICD-10-CM | POA: Diagnosis not present

## 2017-01-24 ENCOUNTER — Encounter: Payer: Self-pay | Admitting: Gynecology

## 2017-02-20 DIAGNOSIS — H25013 Cortical age-related cataract, bilateral: Secondary | ICD-10-CM | POA: Diagnosis not present

## 2017-03-16 ENCOUNTER — Other Ambulatory Visit: Payer: Self-pay | Admitting: Family Medicine

## 2017-03-16 DIAGNOSIS — Z1231 Encounter for screening mammogram for malignant neoplasm of breast: Secondary | ICD-10-CM

## 2017-05-12 DIAGNOSIS — M8580 Other specified disorders of bone density and structure, unspecified site: Secondary | ICD-10-CM

## 2017-05-12 HISTORY — DX: Other specified disorders of bone density and structure, unspecified site: M85.80

## 2017-05-21 ENCOUNTER — Encounter: Payer: MEDICARE | Admitting: Gynecology

## 2017-05-21 ENCOUNTER — Ambulatory Visit
Admission: RE | Admit: 2017-05-21 | Discharge: 2017-05-21 | Disposition: A | Discharge status: Home / Self Care | Payer: MEDICARE | Source: Ambulatory Visit | Attending: Family Medicine | Admitting: Family Medicine

## 2017-05-21 DIAGNOSIS — Z1231 Encounter for screening mammogram for malignant neoplasm of breast: Secondary | ICD-10-CM

## 2017-05-22 ENCOUNTER — Ambulatory Visit (INDEPENDENT_AMBULATORY_CARE_PROVIDER_SITE_OTHER): Payer: MEDICARE | Admitting: Gynecology

## 2017-05-22 ENCOUNTER — Encounter: Payer: Self-pay | Admitting: Gynecology

## 2017-05-22 VITALS — BP 118/80 | Ht 64.0 in | Wt 155.0 lb

## 2017-05-22 DIAGNOSIS — N952 Postmenopausal atrophic vaginitis: Secondary | ICD-10-CM

## 2017-05-22 DIAGNOSIS — M8580 Other specified disorders of bone density and structure, unspecified site: Secondary | ICD-10-CM

## 2017-05-22 DIAGNOSIS — Z01411 Encounter for gynecological examination (general) (routine) with abnormal findings: Secondary | ICD-10-CM

## 2017-05-22 MED ORDER — ESTROGENS, CONJUGATED 0.625 MG/GM VA CREA: TOPICAL_CREAM | VAGINAL | 3 refills | Status: DC

## 2017-05-22 NOTE — Progress Notes (Signed)
    Katherine Fisher 1951-08-09 [id number]        66 y.o.  G2P2 for breast and pelvic exam. Former patient of Dr. Toney Rakes  Past medical history,surgical history, problem list, medications, allergies, family history and social history were all reviewed and documented as reviewed in the EPIC chart.  ROS:  Performed with pertinent positives and negatives included in the history, assessment and plan.   Additional significant findings :  None   Exam: [name] assistant Vitals:   05/22/17 1039  BP: 118/80  Weight: 155 lb (70.3 kg)  Height: 5\' 4"  (1.626 m)   Body mass index is 26.61 kg/m.  General appearance:  Normal affect, orientation and appearance. Skin: Grossly normal HEENT: Without gross lesions.  No cervical or supraclavicular adenopathy. Thyroid normal.  Lungs:  Clear without wheezing, rales or rhonchi Cardiac: RR, without RMG Abdominal:  Soft, nontender, without masses, guarding, rebound, organomegaly or hernia Breasts:  Examined lying and sitting without masses, retractions, discharge or axillary adenopathy. Pelvic:  Ext, BUS, Vagina:With atrophic changes  Adnexa: Without masses or tenderness    Anus and perineum: Normal   Rectovaginal: Normal sphincter tone without palpated masses or tenderness.    Assessment/Plan:  66 y.o. G2P2 female for annual gynecologic exam.   1. Postmenopausal/atrophic genital changes. Status post [facility] LSO for endometriosis and atypical glandular cells 1991.  Using Premarin vaginal cream 0.5 g twice weekly for vaginal dryness. Is doing well with this and wants to continue. Issues of absorption with systemic effects to include thrombosis, breast and endometrial stimulation discussed. Patient comfortable continuing. Refill 1 year provided. 2. Pap smear 2017.  History of atypical Pap smear previously but multiple normal Pap smears since. No Pap smear done today. We'll continue screening but less frequent screening intervals per current screening  guidelines. 3. Mammography 05/2017. Continue with annual mammography next year. Breast exam normal today. 4. Colonoscopy up-to-date with planned repeat 2022. 5. Osteopenia. DEXA 2016 T score -1.5. Recommend repeat DEXA now at 2 year interval. Patient unsure if she has vitamin D level checked elsewhere so will go ahead and check vitamin D today. 6. Health maintenance. No routine lab work done as patient does this elsewhere. Follow up 1 year, sooner as needed.   [name] MD, 11:02 AM 05/22/2017

## 2017-05-22 NOTE — Patient Instructions (Signed)
Follow-up for the bone density as scheduled. 

## 2017-05-23 ENCOUNTER — Encounter: Payer: Self-pay | Admitting: Gynecology

## 2017-05-23 LAB — VITAMIN D 25 HYDROXY (VIT D DEFICIENCY, FRACTURES): Vit D, 25-Hydroxy: 51 ng/mL (ref 30–100)

## 2017-06-05 ENCOUNTER — Other Ambulatory Visit: Payer: Self-pay | Admitting: Gynecology

## 2017-06-05 ENCOUNTER — Encounter: Payer: Self-pay | Admitting: Gynecology

## 2017-06-05 ENCOUNTER — Ambulatory Visit (INDEPENDENT_AMBULATORY_CARE_PROVIDER_SITE_OTHER): Payer: MEDICARE

## 2017-06-05 DIAGNOSIS — M8589 Other specified disorders of bone density and structure, multiple sites: Secondary | ICD-10-CM

## 2017-06-05 DIAGNOSIS — Z780 Asymptomatic menopausal state: Secondary | ICD-10-CM

## 2017-06-05 DIAGNOSIS — M8580 Other specified disorders of bone density and structure, unspecified site: Secondary | ICD-10-CM

## 2017-07-18 DIAGNOSIS — E663 Overweight: Secondary | ICD-10-CM | POA: Diagnosis not present

## 2017-07-18 DIAGNOSIS — Z0000 Encounter for general adult medical examination without abnormal findings: Secondary | ICD-10-CM | POA: Diagnosis not present

## 2017-07-18 DIAGNOSIS — Z86010 Personal history of colonic polyps: Secondary | ICD-10-CM | POA: Diagnosis not present

## 2017-07-18 DIAGNOSIS — J309 Allergic rhinitis, unspecified: Secondary | ICD-10-CM | POA: Diagnosis not present

## 2017-07-18 DIAGNOSIS — Z23 Encounter for immunization: Secondary | ICD-10-CM | POA: Diagnosis not present

## 2017-07-18 DIAGNOSIS — M859 Disorder of bone density and structure, unspecified: Secondary | ICD-10-CM | POA: Diagnosis not present

## 2017-07-18 DIAGNOSIS — I10 Essential (primary) hypertension: Secondary | ICD-10-CM | POA: Diagnosis not present

## 2018-02-01 ENCOUNTER — Other Ambulatory Visit: Payer: Self-pay | Admitting: Family Medicine

## 2018-02-01 DIAGNOSIS — Z1231 Encounter for screening mammogram for malignant neoplasm of breast: Secondary | ICD-10-CM

## 2018-02-09 DIAGNOSIS — J069 Acute upper respiratory infection, unspecified: Secondary | ICD-10-CM | POA: Diagnosis not present

## 2018-02-19 DIAGNOSIS — J0100 Acute maxillary sinusitis, unspecified: Secondary | ICD-10-CM | POA: Diagnosis not present

## 2018-02-19 DIAGNOSIS — J0110 Acute frontal sinusitis, unspecified: Secondary | ICD-10-CM | POA: Diagnosis not present

## 2018-02-19 DIAGNOSIS — R05 Cough: Secondary | ICD-10-CM | POA: Diagnosis not present

## 2018-02-19 DIAGNOSIS — H25013 Cortical age-related cataract, bilateral: Secondary | ICD-10-CM | POA: Diagnosis not present

## 2018-05-22 ENCOUNTER — Ambulatory Visit
Admission: RE | Admit: 2018-05-22 | Discharge: 2018-05-22 | Disposition: A | Discharge status: Home / Self Care | Payer: MEDICARE | Source: Ambulatory Visit | Attending: Family Medicine | Admitting: Family Medicine

## 2018-05-22 DIAGNOSIS — Z1231 Encounter for screening mammogram for malignant neoplasm of breast: Secondary | ICD-10-CM | POA: Diagnosis not present

## 2018-05-23 ENCOUNTER — Encounter: Payer: Self-pay | Admitting: Gynecology

## 2018-05-23 ENCOUNTER — Ambulatory Visit (INDEPENDENT_AMBULATORY_CARE_PROVIDER_SITE_OTHER): Payer: MEDICARE | Admitting: Gynecology

## 2018-05-23 ENCOUNTER — Other Ambulatory Visit: Payer: Self-pay | Admitting: Gynecology

## 2018-05-23 VITALS — BP 152/90 | Ht 64.0 in | Wt 154.0 lb

## 2018-05-23 DIAGNOSIS — Z01419 Encounter for gynecological examination (general) (routine) without abnormal findings: Secondary | ICD-10-CM

## 2018-05-23 DIAGNOSIS — M8580 Other specified disorders of bone density and structure, unspecified site: Secondary | ICD-10-CM

## 2018-05-23 DIAGNOSIS — N952 Postmenopausal atrophic vaginitis: Secondary | ICD-10-CM

## 2018-05-23 MED ORDER — ESTROGENS, CONJUGATED 0.625 MG/GM VA CREA: TOPICAL_CREAM | VAGINAL | 3 refills | Status: DC

## 2018-05-23 MED ORDER — NONFORMULARY OR COMPOUNDED ITEM: 11 refills | Status: DC

## 2018-05-23 NOTE — Telephone Encounter (Signed)
[facility] formulated estradiol vaginal cream twice weekly 43-month supply refill x1 year is usually cheaper

## 2018-05-23 NOTE — Telephone Encounter (Signed)
Called and spoke with patient.

## 2018-05-23 NOTE — Telephone Encounter (Signed)
Called and spoke with patient and explained the option of compounded estradiol cream. She would like to do this and would like to get it at [facility] since she is familiar with them.  Rx called in.

## 2018-05-23 NOTE — Patient Instructions (Signed)
Follow-up in 1 year for gynecologic exam.  Sooner if any issues.

## 2018-05-23 NOTE — Telephone Encounter (Signed)
Pharmacy comment: Alternative Requested:PATIENT REQUESTING ALTERNATIVE RX. DRUG IS $376.95 AFTER INSURANCE

## 2018-05-23 NOTE — Progress Notes (Signed)
    Katherine Fisher August 07, 1951 [id number]        66 y.o.  G2P2 for breast and pelvic exam.  Without gynecologic complaints  Past medical history,surgical history, problem list, medications, allergies, family history and social history were all reviewed and documented as reviewed in the EPIC chart.  ROS:  Performed with pertinent positives and negatives included in the history, assessment and plan.   Additional significant findings : None   Exam: Copywriter, advertising Vitals:   05/23/18 1031  BP: (!) 152/90  Weight: 154 lb (69.9 kg)  Height: 5\' 4"  (1.626 m)   Body mass index is 26.43 kg/m.  General appearance:  Normal affect, orientation and appearance. Skin: Grossly normal HEENT: Without gross lesions.  No cervical or supraclavicular adenopathy. Thyroid normal.  Lungs:  Clear without wheezing, rales or rhonchi Cardiac: RR, without RMG Abdominal:  Soft, nontender, without masses, guarding, rebound, organomegaly or hernia Breasts:  Examined lying and sitting without masses, retractions, discharge or axillary adenopathy. Pelvic:  Ext, BUS, Vagina: With atrophic changes  Adnexa: Without masses or tenderness    Anus and perineum: Normal   Rectovaginal: Normal sphincter tone without palpated masses or tenderness.    Assessment/Plan:  67 y.o. G2P2 female for breast and pelvic exam  1. Postmenopausal/atrophic genital changes.  Status post T[facility] LSO for endometriosis and atypical glandular cells.  Using Premarin vaginal cream twice weekly due to vaginal dryness.  Doing well with this.  With discussed the issues of absorption and systemic effects in the past and she is comfortable continuing this.  Refill x1 year provided. 2. Pap smear 2017.  No Pap smear done today.  History of atypical glandular cells previously with numerous negative Pap smears since.  We will plan follow-up Pap smear next year at 3-year interval. 3. Mammography yesterday was normal.  Continue with annual mammography next  year.  Breast exam normal today. 4. Colonoscopy up-to-date with planned repeat 2022 5. Osteopenia.  DEXA 2018 T score -1.5 FRAX 4% / 0.4%.  Plan repeat DEXA next year at 2-year interval. 6. Health maintenance.  Blood pressure 152/90 discussed.  She relates whitecoat hypertension.  Stressed the need to have monitored in a non-exam situation and to follow-up with her primary if continues elevated.  Follow-up in 1 year for annual gynecologic exam.   [name] MD, 10:58 AM 05/23/2018

## 2018-06-06 ENCOUNTER — Telehealth: Payer: Self-pay | Admitting: *Deleted

## 2018-06-06 NOTE — Telephone Encounter (Signed)
Patient has been on premarin vaginal cream for years, reports for at least 2-3 years he had achy feeling in her leg, notes achy feeling just above the ankle, but not in calf muscle, sensation is now starting to move to both legs. Patient said she was reading a article about premarin and it mentioned leg pain. Patient asked if she should stop medication? Please advise

## 2018-06-07 NOTE — Telephone Encounter (Signed)
Absorption from vaginal estrogen creams is usually very low without significant systemic effects.  Cannot argue though that in a specific individual they may absorb more.  Estrogen usually is not associated with cramping or discomfort.  The concern when talking about lower leg pain would be deep venous thromboses or blood clots.  This is associated with estrogen.  This usually is an acute event that happens and causes pain and stays there and does not migrate around.  Regardless if there is any question I would just stop the vaginal estrogen and see how she does.

## 2018-06-07 NOTE — Telephone Encounter (Signed)
Patient informed, she will stop medication.

## 2018-06-11 DIAGNOSIS — Z23 Encounter for immunization: Secondary | ICD-10-CM | POA: Diagnosis not present

## 2018-06-11 DIAGNOSIS — M79605 Pain in left leg: Secondary | ICD-10-CM | POA: Diagnosis not present

## 2018-06-11 DIAGNOSIS — Z8739 Personal history of other diseases of the musculoskeletal system and connective tissue: Secondary | ICD-10-CM | POA: Diagnosis not present

## 2018-06-11 DIAGNOSIS — R030 Elevated blood-pressure reading, without diagnosis of hypertension: Secondary | ICD-10-CM | POA: Diagnosis not present

## 2018-06-15 DIAGNOSIS — M545 Low back pain: Secondary | ICD-10-CM | POA: Diagnosis not present

## 2018-06-15 DIAGNOSIS — R200 Anesthesia of skin: Secondary | ICD-10-CM | POA: Diagnosis not present

## 2018-06-15 DIAGNOSIS — M4316 Spondylolisthesis, lumbar region: Secondary | ICD-10-CM | POA: Diagnosis not present

## 2018-08-14 ENCOUNTER — Encounter: Payer: Self-pay | Admitting: *Deleted

## 2018-08-14 ENCOUNTER — Ambulatory Visit
Admission: RE | Admit: 2018-08-14 | Discharge: 2018-08-14 | Disposition: A | Discharge status: Home / Self Care | Payer: MEDICARE | Source: Ambulatory Visit | Attending: Neurology | Admitting: Neurology

## 2018-08-14 ENCOUNTER — Telehealth: Payer: Self-pay | Admitting: Neurology

## 2018-08-14 ENCOUNTER — Encounter: Payer: Self-pay | Admitting: Neurology

## 2018-08-14 ENCOUNTER — Ambulatory Visit (INDEPENDENT_AMBULATORY_CARE_PROVIDER_SITE_OTHER): Payer: MEDICARE | Admitting: Neurology

## 2018-08-14 VITALS — BP 160/95 | HR 71 | Ht 64.0 in | Wt 155.0 lb

## 2018-08-14 DIAGNOSIS — G8929 Other chronic pain: Secondary | ICD-10-CM | POA: Diagnosis not present

## 2018-08-14 DIAGNOSIS — M5442 Lumbago with sciatica, left side: Secondary | ICD-10-CM

## 2018-08-14 DIAGNOSIS — M5416 Radiculopathy, lumbar region: Secondary | ICD-10-CM | POA: Diagnosis not present

## 2018-08-14 DIAGNOSIS — R200 Anesthesia of skin: Secondary | ICD-10-CM | POA: Diagnosis not present

## 2018-08-14 DIAGNOSIS — R29898 Other symptoms and signs involving the musculoskeletal system: Secondary | ICD-10-CM | POA: Diagnosis not present

## 2018-08-14 DIAGNOSIS — M25552 Pain in left hip: Secondary | ICD-10-CM | POA: Diagnosis not present

## 2018-08-14 DIAGNOSIS — M5417 Radiculopathy, lumbosacral region: Secondary | ICD-10-CM | POA: Insufficient documentation

## 2018-08-14 MED ORDER — GABAPENTIN 300 MG PO CAPS: 300.0000 mg | ORAL_CAPSULE | Freq: Three times a day (TID) | ORAL | 11 refills | Status: DC

## 2018-08-14 NOTE — Addendum Note (Signed)
Addended by: Sarina Ill B on: 08/14/2018 09:53 AM   Modules accepted: Orders

## 2018-08-14 NOTE — Progress Notes (Addendum)
[facility]    Provider:  Dr Jaynee Eagles Referring Provider: Suella Broad, MD Primary Care Physician:  Kathyrn Lass, MD  CC:  Numbness legs  HPI:  Katherine Fisher is a 67 y.o. female here as requested by Dr. Nelva Bush for numbness. PMHx Osteopenia, HTN, arthritis. In 2013 with Dr. Tonita Cong decompression surgery lumbosacral. She had numbness in the left later lower leg, low back, weakness in the left leg. She had the surgery and improved slightly but never got better. Ongoing for years. In August she had acute worsening she could hardly walk. She had pain in her leg mostly on the outer left leg radiated to the top and bottom of foot. Also had back pain as well. She has now started to feel it on the right side. She can feel pain in the low back and hips. She wakes with it and sleeps with it. She has a burning and sharp pain through, Always in the setting of low back pain radiating up to the back. Can be severe. She may have to sit on a pillow. She has not had imaging for years. 6 months of conservative treatment, she saw Dr. Nelva Bush at orthopaedics,   Reviewed notes, labs and imaging from outside physicians, which showed:  Reviewed notes from Dr. Herma Mering.  Patient has dull left pain which can also be sharp and aching in the lower extremity, moderate, ongoing 7 years, chronic, laying down on her left side and walking makes it worse associated numbness and tingling, she had lumbar decompression in 2013, had a lumbar epidural injection to the left L4-L5.  Usually she can tolerate it however she recently had some acute weakness.  About 3 weeks prior she was having some significant pain in her left greater than right lower limb as well as some weakness.  Times are worse when she is lying down on her side on the left or when she is walking.  She is also concerned she may have neuropathy her sisters have idiopathic peripheral neuropathy.  Is not diabetic.  Review of Systems: Patient complains of symptoms  per HPI as well as the following symptoms:numbness, restless. Pertinent negatives and positives per HPI. All others negative.   Social History   Socioeconomic History  . Marital status: Married    Spouse name: Not on file  . Number of children: 2  . Years of education: Not on file  . Highest education level: High school graduate  Occupational History  . Not on file  Social Needs  . Financial resource strain: Not on file  . Food insecurity:    Worry: Not on file    Inability: Not on file  . Transportation needs:    Medical: Not on file    Non-medical: Not on file  Tobacco Use  . Smoking status: Never Smoker  . Smokeless tobacco: Never Used  Substance and Sexual Activity  . Alcohol use: No    Alcohol/week: 0.0 standard drinks  . Drug use: No  . Sexual activity: Not Currently    Birth control/protection: Surgical    Comment: DES NEG,DECLINED INSURANCE QUESTIONS  Lifestyle  . Physical activity:    Days per week: Not on file    Minutes per session: Not on file  . Stress: Not on file  Relationships  . Social connections:    Talks on phone: Not on file    Gets together: Not on file    Attends religious service: Not on file    Active member of club or  organization: Not on file    Attends meetings of clubs or organizations: Not on file    Relationship status: Not on file  . Intimate partner violence:    Fear of current or ex partner: Not on file    Emotionally abused: Not on file    Physically abused: Not on file    Forced sexual activity: Not on file  Other Topics Concern  . Not on file  Social History Narrative   Lives at home with her husband   Right handed   Caffeine: 1 soda per week    Family History  Problem Relation Age of Onset  . Heart attack Father   . Heart disease Father   . Hypertension Mother   . Breast cancer Mother   . Arthritis Mother   . Stomach cancer Mother   . Breast cancer Maternal Aunt   . Breast cancer Cousin        several cousins     . Hypertension Sister   . Arthritis Sister   . Neuropathy Sister   . Other Sister        H. Pylori  . Heart disease Paternal Uncle   . Hypertension Sister   . Arthritis Sister   . Heart murmur Sister   . Hypertension Sister   . Arthritis Sister   . Heart murmur Sister   . Hypertension Sister   . Arthritis Sister     Past Medical History:  Diagnosis Date  . Arthritis    lower anemia   . GERD (gastroesophageal reflux disease)    hx of   . History of colonic polyps   . HTN (hypertension)   . Osteopenia 05/2017   T score -1.5 FRAX 4%/0.4%    Past Surgical History:  Procedure Laterality Date  . ABDOMINAL HYSTERECTOMY  1991   AGUS  . BREAST BIOPSY  1998   x2 on right   . CHOLECYSTECTOMY  04/28/2004  . LUMBAR LAMINECTOMY/DECOMPRESSION MICRODISCECTOMY  07/25/2012   Procedure: LUMBAR LAMINECTOMY/DECOMPRESSION MICRODISCECTOMY 2 LEVELS;  Surgeon: Johnn Hai, MD;  Location: [facility];  Service: Orthopedics;  Laterality: Left;  Lumbar decompression L3-L4, L4 - L5 on the Left (X-Ray)  . NECK SURGERY  07/20/2003   for pinched nerve  . PARTIAL HYSTERECTOMY  1991    Current Outpatient Medications  Medication Sig Dispense Refill  . aspirin 81 MG tablet Take 81 mg by mouth daily.    . fluticasone (FLONASE) 50 MCG/ACT nasal spray Place 2 sprays into the nose daily as needed. allergies    . levocetirizine (XYZAL) 5 MG tablet Take 5 mg by mouth every evening. As needed    . lisinopril (PRINIVIL,ZESTRIL) 40 MG tablet Take 40 mg by mouth daily.    . Multiple Vitamin (MULTIVITAMIN) tablet Take 1 tablet by mouth daily.    . Omega-3 Fatty Acids (FISH OIL) 1000 MG CAPS Take 1,000 mg by mouth daily.    . vitamin B-12 (CYANOCOBALAMIN) 1000 MCG tablet Take 1,000 mcg by mouth daily.    Marland Kitchen VITAMIN D PO Take 2,000 Int'l Units by mouth daily.    Marland Kitchen conjugated estrogens (PREMARIN) vaginal cream Place 1 Applicatorful vaginally 2 (two) times a week.     No current facility-administered medications  for this visit.     Allergies as of 08/14/2018  . (No Known Allergies)    Vitals: BP (!) 160/95 (BP Location: Right Arm, Patient Position: Sitting) Comment: per pt always elevated at md office. 139/73 at home this AM.  Pulse 71   Ht 5\' 4"  (1.626 m)   Wt 155 lb (70.3 kg)   BMI 26.61 kg/m  Last Weight:  Wt Readings from Last 1 Encounters:  08/14/18 155 lb (70.3 kg)   Last Height:   Ht Readings from Last 1 Encounters:  08/14/18 5\' 4"  (1.626 m)   Physical exam: Exam: Gen: NAD, conversant, well nourised, obese, well groomed                     CV: RRR, no MRG. No Carotid Bruits. No peripheral edema, warm, nontender Eyes: Conjunctivae clear without exudates or hemorrhage  Neuro: Detailed Neurologic Exam  Speech:    Speech is normal; fluent and spontaneous with normal comprehension.  Cognition:    The patient is oriented to person, place, and time;     recent and remote memory intact;     language fluent;     normal attention, concentration,     fund of knowledge Cranial Nerves:    The pupils are equal, round, and reactive to light. The fundi are normal and spontaneous venous pulsations are present. Visual fields are full to finger confrontation. Extraocular movements are intact. Trigeminal sensation is intact and the muscles of mastication are normal. The face is symmetric. The palate elevates in the midline. Hearing intact. Voice is normal. Shoulder shrug is normal. The tongue has normal motion without fasciculations.   Coordination:    No dysmetria  Gait:    Difficulty with heel walking left > right  Motor Observation:    No asymmetry, no atrophy, and no involuntary movements noted. Tone:    Normal muscle tone.    Posture:    Posture is normal. normal erect    Strength: left hip flexion 4/5 possibly due to pain, left leg flexion and dorsiflexion 4+/5    Strength is V/V in the upper and lower limbs.      Sensation: Decreased left L5/s1     Reflex  Exam:  DTR's: slightly decreased left patellar and AJ otherwise deep tendon reflexes in the upper and lower extremities are normal bilaterally.   Toes:    The toes are downgoing bilaterally.   Clonus:    Clonus is absent.       Assessment/Plan:  67 year old with acute on chronic lumbar radic failed conservative treatment 6 months.  Less likely peripheral neuropathy, sensory symptoms in an L5/S1 distribution and not in a polyneuropathic pattern.   Left Xray hip for hip pain MRI lumbar spine due to acute on chronic left>right lumbar radiculopathy, prior surgery, 6 months conservative management failure need to evaluate for surgery vs other option. EMG/NCS  Orders Placed This Encounter  Procedures  . MR LUMBAR SPINE WO CONTRAST  . DG HIP UNILAT WITH PELVIS 2-3 VIEWS LEFT  . NCV with EMG(electromyography)   Gabapentin for pain   Meds ordered this encounter  Medications  . gabapentin (NEURONTIN) 300 MG capsule    Sig: Take 1 capsule (300 mg total) by mouth 3 (three) times daily.    Dispense:  90 capsule    Refill:  11     Cc: Dr. Campbell Riches, MD  [facility] [address] [place], [place] [address]  Phone [phone] Fax [phone]

## 2018-08-14 NOTE — Telephone Encounter (Signed)
Medicare/[place] order sent to [facility]. No auth they will reach out to the pt to schedule.

## 2018-08-14 NOTE — Patient Instructions (Addendum)
Emg/ncs MRI lumbar spine XR hip  Electromyoneurogram Electromyoneurogram is a test to check how well your muscles and nerves are working. This procedure includes the combined use of electromyogram (EMG) and nerve conduction study (NCS). EMG is used to look for muscular disorders. NCS, which is also called electroneurogram, measures how well your nerves are controlling your muscles. The procedures are usually performed together to check if your muscles and nerves are healthy. If the reaction to testing is abnormal, this can indicate disease or injury, such as peripheral nerve damage. Tell a health care provider about:  Any allergies you have.  All medicines you are taking, including vitamins, herbs, eye drops, creams, and over-the-counter medicines.  Any problems you or family members have had with anesthetic medicines.  Any blood disorders you have.  Any surgeries you have had.  Any medical conditions you have.  Any pacemaker you have. What are the risks? Generally, this is a safe procedure. However, problems may occur, including:  Infection where the electrodes were inserted.  Bleeding.  What happens before the procedure?  Ask your health care provider about: ? Changing or stopping your regular medicines. This is especially important if you are taking diabetes medicines or blood thinners. ? Taking medicines such as aspirin and ibuprofen. These medicines can thin your blood. Do not take these medicines before your procedure if your health care provider instructs you not to.  Your health care provider may ask you to avoid: ? Caffeine, such as coffee and tea. ? Nicotine. This includes cigarettes and anything with tobacco.  Do not use lotions or creams on the same day that you will be having the procedure. What happens during the procedure? For EMG:  Your health care provider will ask you to stay in a position so that he or she can access the muscle that will be studied. You  may be standing, sitting down, or lying down.  You may be given a medicine that numbs the area (local anesthetic).  A very thin needle that has an electrode on it will be inserted into your muscle.  Another small electrode will be placed on your skin near the muscle.  Your health care provider will ask you to continue to remain still.  The electrodes will send a signal that tells about the electrical activity of your muscles. You may see this on a monitor or hear it in the room.  After your muscles have been studied at rest, your health care provider will ask you to contract or flex your muscles. The electrodes will send a signal that tells about the electrical activity of your muscles.  Your health care provider will remove the electrodes and the electrode needles when the procedure is finished. The procedure may vary among health care providers and hospitals. For NCS:  An electrode that records your nerve activity (recording electrode) will be placed on your skin by the muscle that is being studied.  An electrode that is used as a reference (reference electrode) will be placed near the recording electrode.  A paste or gel will be applied to your skin between the recording electrode and the reference electrode.  Your nerve will be stimulated with a mild shock. Your health care provider will measure how much time it takes for your muscle to react.  Your health care provider will remove the electrodes and the gel when the procedure is finished. The procedure may vary among health care providers and hospitals. What happens after the procedure?  It  is your responsibility to obtain your test results. Ask your health care provider or the department performing the test when and how you will get your results.  Your health care provider may: ? Give you medicines for any pain. ? Monitor the insertion sites to make sure that they stop bleeding. This information is not intended to replace  advice given to you by your health care provider. Make sure you discuss any questions you have with your health care provider. Document Released: 12/29/2004 Document Revised: 02/03/2016 Document Reviewed: 10/19/2014 Elsevier Interactive Patient Education  2018 [employer].  Gabapentin capsules or tablets What is this medicine? GABAPENTIN (GA ba pen tin) is used to control partial seizures in adults with epilepsy. It is also used to treat certain types of nerve pain. This medicine may be used for other purposes; ask your health care provider or pharmacist if you have questions. COMMON BRAND NAME(S): Active-PAC with Gabapentin, Gabarone, Neurontin What should I tell my health care provider before I take this medicine? They need to know if you have any of these conditions: -kidney disease -suicidal thoughts, plans, or attempt; a previous suicide attempt by you or a family member -an unusual or allergic reaction to gabapentin, other medicines, foods, dyes, or preservatives -pregnant or trying to get pregnant -breast-feeding How should I use this medicine? Take this medicine by mouth with a glass of water. Follow the directions on the prescription label. You can take it with or without food. If it upsets your stomach, take it with food.Take your medicine at regular intervals. Do not take it more often than directed. Do not stop taking except on your doctor's advice. If you are directed to break the 600 or 800 mg tablets in half as part of your dose, the extra half tablet should be used for the next dose. If you have not used the extra half tablet within 28 days, it should be thrown away. A special MedGuide will be given to you by the pharmacist with each prescription and refill. Be sure to read this information carefully each time. Talk to your pediatrician regarding the use of this medicine in children. Special care may be needed. Overdosage: If you think you have taken too much of this medicine  contact a poison control center or emergency room at once. NOTE: This medicine is only for you. Do not share this medicine with others. What if I miss a dose? If you miss a dose, take it as soon as you can. If it is almost time for your next dose, take only that dose. Do not take double or extra doses. What may interact with this medicine? Do not take this medicine with any of the following medications: -other gabapentin products This medicine may also interact with the following medications: -alcohol -antacids -antihistamines for allergy, cough and cold -certain medicines for anxiety or sleep -certain medicines for depression or psychotic disturbances -homatropine; hydrocodone -naproxen -narcotic medicines (opiates) for pain -phenothiazines like chlorpromazine, mesoridazine, prochlorperazine, thioridazine This list may not describe all possible interactions. Give your health care provider a list of all the medicines, herbs, non-prescription drugs, or dietary supplements you use. Also tell them if you smoke, drink alcohol, or use illegal drugs. Some items may interact with your medicine. What should I watch for while using this medicine? Visit your doctor or health care professional for regular checks on your progress. You may want to keep a record at home of how you feel your condition is responding to treatment. You  may want to share this information with your doctor or health care professional at each visit. You should contact your doctor or health care professional if your seizures get worse or if you have any new types of seizures. Do not stop taking this medicine or any of your seizure medicines unless instructed by your doctor or health care professional. Stopping your medicine suddenly can increase your seizures or their severity. Wear a medical identification bracelet or chain if you are taking this medicine for seizures, and carry a card that lists all your medications. You may get  drowsy, dizzy, or have blurred vision. Do not drive, use machinery, or do anything that needs mental alertness until you know how this medicine affects you. To reduce dizzy or fainting spells, do not sit or stand up quickly, especially if you are an older patient. Alcohol can increase drowsiness and dizziness. Avoid alcoholic drinks. Your mouth may get dry. Chewing sugarless gum or sucking hard candy, and drinking plenty of water will help. The use of this medicine may increase the chance of suicidal thoughts or actions. Pay special attention to how you are responding while on this medicine. Any worsening of mood, or thoughts of suicide or dying should be reported to your health care professional right away. Women who become pregnant while using this medicine may enroll in the [place] Pregnancy Registry by calling [phone]. This registry collects information about the safety of antiepileptic drug use during pregnancy. What side effects may I notice from receiving this medicine? Side effects that you should report to your doctor or health care professional as soon as possible: -allergic reactions like skin rash, itching or hives, swelling of the face, lips, or tongue -worsening of mood, thoughts or actions of suicide or dying Side effects that usually do not require medical attention (report to your doctor or health care professional if they continue or are bothersome): -constipation -difficulty walking or controlling muscle movements -dizziness -nausea -slurred speech -tiredness -tremors -weight gain This list may not describe all possible side effects. Call your doctor for medical advice about side effects. You may report side effects to FDA at 1-800-FDA-1088. Where should I keep my medicine? Keep out of reach of children. This medicine may cause accidental overdose and death if it taken by other adults, children, or pets. Mix any unused medicine with a substance  like cat litter or coffee grounds. Then throw the medicine away in a sealed container like a sealed bag or a coffee can with a lid. Do not use the medicine after the expiration date. Store at room temperature between 15 and 30 degrees C (59 and 86 degrees F). NOTE: This sheet is a summary. It may not cover all possible information. If you have questions about this medicine, talk to your doctor, pharmacist, or health care provider.  2018 Elsevier/Gold Standard (2013-10-24 15:26:50)

## 2018-08-18 ENCOUNTER — Telehealth: Payer: Self-pay | Admitting: Neurology

## 2018-08-18 ENCOUNTER — Ambulatory Visit
Admission: RE | Admit: 2018-08-18 | Discharge: 2018-08-18 | Disposition: A | Discharge status: Home / Self Care | Payer: MEDICARE | Source: Ambulatory Visit | Attending: Neurology | Admitting: Neurology

## 2018-08-18 DIAGNOSIS — R29898 Other symptoms and signs involving the musculoskeletal system: Secondary | ICD-10-CM

## 2018-08-18 DIAGNOSIS — M5416 Radiculopathy, lumbar region: Secondary | ICD-10-CM

## 2018-08-18 DIAGNOSIS — G8929 Other chronic pain: Secondary | ICD-10-CM

## 2018-08-18 DIAGNOSIS — M5442 Lumbago with sciatica, left side: Secondary | ICD-10-CM

## 2018-08-18 DIAGNOSIS — R200 Anesthesia of skin: Secondary | ICD-10-CM

## 2018-08-18 NOTE — Telephone Encounter (Signed)
The MRI of the lumbar spine showed some narrowing where the news come out. This can be causing her lower back symptoms. At this point PT or steroid injections may help. We can also send her back to Dr. Tonita Cong foe evaluation of surgical options. Please let me know which she would prefer thanks. See phone note I am sending with this.

## 2018-08-19 ENCOUNTER — Other Ambulatory Visit: Payer: Self-pay | Admitting: *Deleted

## 2018-08-19 DIAGNOSIS — Z1389 Encounter for screening for other disorder: Secondary | ICD-10-CM | POA: Diagnosis not present

## 2018-08-19 DIAGNOSIS — M859 Disorder of bone density and structure, unspecified: Secondary | ICD-10-CM | POA: Diagnosis not present

## 2018-08-19 DIAGNOSIS — M47896 Other spondylosis, lumbar region: Secondary | ICD-10-CM | POA: Diagnosis not present

## 2018-08-19 DIAGNOSIS — Z0000 Encounter for general adult medical examination without abnormal findings: Secondary | ICD-10-CM | POA: Diagnosis not present

## 2018-08-19 DIAGNOSIS — I10 Essential (primary) hypertension: Secondary | ICD-10-CM | POA: Diagnosis not present

## 2018-08-19 DIAGNOSIS — J309 Allergic rhinitis, unspecified: Secondary | ICD-10-CM | POA: Diagnosis not present

## 2018-08-19 DIAGNOSIS — D649 Anemia, unspecified: Secondary | ICD-10-CM | POA: Diagnosis not present

## 2018-08-19 DIAGNOSIS — M5416 Radiculopathy, lumbar region: Secondary | ICD-10-CM

## 2018-08-19 DIAGNOSIS — M79662 Pain in left lower leg: Secondary | ICD-10-CM | POA: Diagnosis not present

## 2018-08-19 NOTE — Telephone Encounter (Signed)
Pt returned my call. Discussed that per Dr. Jaynee Eagles, pt's MRI of her lumbar spine showed some narrowing where the nerves come out. This can be causing her lower back symptoms. At this point PT or steroid injections may help. Dr. Jaynee Eagles can also send her back to Dr. Tonita Cong for evaluation of surgical options. Asked pt what she would like to do. At this time pt would like to go back to Dr. Tonita Cong for evaluation. She said she didn't think the injections helped in the past. Pt aware that we will send the referral to [facility]. Pt verbalized appreciation.

## 2018-08-19 NOTE — Progress Notes (Signed)
Referral to Dr. Tonita Cong.

## 2018-08-19 NOTE — Telephone Encounter (Signed)
Called pt & left message with gentleman who will have her call back when she gets home from another appt. Left number with him.

## 2018-08-19 NOTE — Telephone Encounter (Signed)
Referral placed to Dr. Tonita Cong.

## 2018-08-27 DIAGNOSIS — M48061 Spinal stenosis, lumbar region without neurogenic claudication: Secondary | ICD-10-CM | POA: Diagnosis not present

## 2018-08-27 DIAGNOSIS — M5136 Other intervertebral disc degeneration, lumbar region: Secondary | ICD-10-CM | POA: Diagnosis not present

## 2018-08-27 DIAGNOSIS — M545 Low back pain: Secondary | ICD-10-CM | POA: Diagnosis not present

## 2018-09-05 ENCOUNTER — Other Ambulatory Visit: Payer: Self-pay | Admitting: Neurology

## 2018-09-24 DIAGNOSIS — M5136 Other intervertebral disc degeneration, lumbar region: Secondary | ICD-10-CM | POA: Diagnosis not present

## 2018-09-24 DIAGNOSIS — M961 Postlaminectomy syndrome, not elsewhere classified: Secondary | ICD-10-CM | POA: Diagnosis not present

## 2018-10-08 DIAGNOSIS — M4316 Spondylolisthesis, lumbar region: Secondary | ICD-10-CM | POA: Diagnosis not present

## 2018-10-08 DIAGNOSIS — M961 Postlaminectomy syndrome, not elsewhere classified: Secondary | ICD-10-CM | POA: Diagnosis not present

## 2018-10-08 DIAGNOSIS — M5136 Other intervertebral disc degeneration, lumbar region: Secondary | ICD-10-CM | POA: Diagnosis not present

## 2018-10-08 DIAGNOSIS — M48061 Spinal stenosis, lumbar region without neurogenic claudication: Secondary | ICD-10-CM | POA: Diagnosis not present

## 2018-10-15 DIAGNOSIS — Z01818 Encounter for other preprocedural examination: Secondary | ICD-10-CM | POA: Diagnosis not present

## 2018-10-15 DIAGNOSIS — M47896 Other spondylosis, lumbar region: Secondary | ICD-10-CM | POA: Diagnosis not present

## 2018-10-15 DIAGNOSIS — I10 Essential (primary) hypertension: Secondary | ICD-10-CM | POA: Diagnosis not present

## 2018-10-15 DIAGNOSIS — Z6827 Body mass index (BMI) 27.0-27.9, adult: Secondary | ICD-10-CM | POA: Diagnosis not present

## 2018-10-21 DIAGNOSIS — Z6826 Body mass index (BMI) 26.0-26.9, adult: Secondary | ICD-10-CM | POA: Diagnosis not present

## 2018-10-21 DIAGNOSIS — I10 Essential (primary) hypertension: Secondary | ICD-10-CM | POA: Diagnosis not present

## 2018-10-24 ENCOUNTER — Ambulatory Visit: Payer: Self-pay | Admitting: Orthopedic Surgery

## 2018-10-31 ENCOUNTER — Encounter: Payer: MEDICARE | Admitting: Neurology

## 2018-11-05 ENCOUNTER — Ambulatory Visit: Payer: Self-pay | Admitting: Orthopedic Surgery

## 2018-11-05 NOTE — H&P (Signed)
Katherine Fisher is an 68 y.o. female.   Chief Complaint: back and left leg pain HPI: Reason for Visit: (normal) visit for: (back); The patient is 6 years out from decompression @ L3-4 and L4-5. Location (Lower Extremity): lower back pain bilateral; leg pain on the left, , Severity: pain level 6/10 Timing: constant Quality: sharp; aching; burning Aggravating Factors: sitting for Associated Symptoms: numbness/tingling (left foot) Medications: helping a little; The patient is taking Gabapentin. Notes: The patient is 2 weeks out from [facility] @ L4-5 left. I reviewed her note from the injection was done at L4-5 on left.  She had relief for an hour or 2 and then the pain returned. Radiates down her calf and through her foot  Past Medical History:  Diagnosis Date  . Arthritis    lower anemia   . GERD (gastroesophageal reflux disease)    hx of   . History of colonic polyps   . HTN (hypertension)   . Osteopenia 05/2017   T score -1.5 FRAX 4%/0.4%    Past Surgical History:  Procedure Laterality Date  . ABDOMINAL HYSTERECTOMY  1991   AGUS  . BREAST BIOPSY  1998   x2 on right   . CHOLECYSTECTOMY  04/28/2004  . LUMBAR LAMINECTOMY/DECOMPRESSION MICRODISCECTOMY  07/25/2012   Procedure: LUMBAR LAMINECTOMY/DECOMPRESSION MICRODISCECTOMY 2 LEVELS;  Surgeon: Johnn Hai, MD;  Location: [facility];  Service: Orthopedics;  Laterality: Left;  Lumbar decompression L3-L4, L4 - L5 on the Left (X-Ray)  . NECK SURGERY  07/20/2003   for pinched nerve  . PARTIAL HYSTERECTOMY  1991    Family History  Problem Relation Age of Onset  . Heart attack Father   . Heart disease Father   . Hypertension Mother   . Breast cancer Mother   . Arthritis Mother   . Stomach cancer Mother   . Breast cancer Maternal Aunt   . Breast cancer Cousin        several cousins   . Hypertension Sister   . Arthritis Sister   . Neuropathy Sister   . Other Sister        H. Pylori  . Heart disease Paternal Uncle   . Hypertension  Sister   . Arthritis Sister   . Heart murmur Sister   . Hypertension Sister   . Arthritis Sister   . Heart murmur Sister   . Hypertension Sister   . Arthritis Sister    Social History:  reports that she has never smoked. She has never used smokeless tobacco. She reports that she does not drink alcohol or use drugs.  Allergies: No Known Allergies  Medications: Baby Aspirin 81 mg chewable tablet Flonase Allergy Relief 50 mcg/actuation nasal spray,suspension gabapentin 300 mg capsule levocetirizine 5 mg tablet lisinopriL 40 mg tablet Multivitamins W/Multi-Minerals Omega 3 Fish Oil predniSONE 5 mg tablets in a dose pack Vitamin B12 2,500 mcg tablet Vitamin D3 50 mcg (2,000 unit) tablet  Review of Systems  Constitutional: Negative.   HENT: Negative.   Eyes: Negative.   Respiratory: Negative.   Cardiovascular: Negative.   Gastrointestinal: Negative.   Genitourinary: Negative.   Musculoskeletal: Positive for back pain.  Skin: Negative.   Neurological: Positive for sensory change and focal weakness.  Psychiatric/Behavioral: Negative.     There were no vitals taken for this visit. Physical Exam  Constitutional: She is oriented to person, place, and time. She appears well-developed and well-nourished.  HENT:  Head: Normocephalic.  Eyes: Pupils are equal, round, and reactive to light.  Neck: Normal range of motion.  Cardiovascular: Normal rate.  Respiratory: Effort normal.  GI: Soft.  Musculoskeletal:     Comments: Patient is a 68 year old female.  Gait and Station: Appearance: ambulating with no assistive devices and antalgic gait.  Constitutional: General Appearance: healthy-appearing and distress (mild).  Psychiatric: Mood and Affect: active and alert.  Cardiovascular System: Edema Right: none; Dorsalis and posterior tibial pulses 2+. Edema Left: none.  Abdomen: Inspection and Palpation: non-distended and no tenderness.  Skin: Inspection and palpation: no  rash.  Lumbar Spine: Inspection: normal alignment. Bony Palpation of the Lumbar Spine: tender at lumbosacral junction.. Bony Palpation of the Right Hip: no tenderness of the greater trochanter and tenderness of the SI joint; Pelvis stable. Bony Palpation of the Left Hip: no tenderness of the greater trochanter and tenderness of the SI joint. Soft Tissue Palpation on the Right: No flank pain with percussion. Active Range of Motion: limited flexion and extention.  Motor Strength: L1 Motor Strength on the Right: hip flexion iliopsoas 5/5. L1 Motor Strength on the Left: hip flexion iliopsoas 5/5. L2-L4 Motor Strength on the Right: knee extension quadriceps 5/5. L2-L4 Motor Strength on the Left: knee extension quadriceps 5/5. L5 Motor Strength on the Right: ankle dorsiflexion tibialis anterior 5/5 and great toe extension extensor hallucis longus 5/5. L5 Motor Strength on the Left: ankle dorsiflexion tibialis anterior 5/5 and great toe extension extensor hallucis longus 4/5. S1 Motor Strength on the Right: plantar flexion gastrocnemius 5/5. S1 Motor Strength on the Left: plantar flexion gastrocnemius 5/5.  Neurological System: Knee Reflex Right: normal (2). Knee Reflex Left: normal (2). Ankle Reflex Right: normal (2). Ankle Reflex Left: normal (2). Babinski Reflex Right: plantar reflex absent. Babinski Reflex Left: plantar reflex absent. Sensation on the Right: normal distal extremities. Sensation on the Left: normal distal extremities. Special Tests on the Right: no clonus of the ankle/knee. Special Tests on the Left: no clonus of the ankle/knee and seated straight leg raising test positive.  Neurological: She is alert and oriented to person, place, and time.  Skin: Skin is warm and dry.    MRI demonstrates lateral recess stenosis at L4-5 with facet hypertrophy.  Temporary relief from an epidural at L4-5 on left.  Assessment/Plan We discussed options including living with her symptoms with activity  modification.  Versus revision microlumbar decompression at L4-5 with decompression of the left lateral recess and the L5 nerve root.  She would like to proceed with the latter  I had an extensive discussion with the patient concerning the pathology relevant anatomy and treatment options. At this point exhausting conservative treatment and in the presence of a neurologic deficit we discussed microlumbar decompression. I discussed the risks and benefits including bleeding, infection, DVT, PE, anesthetic complications, worsening in their symptoms, improvement in their symptoms, C SF leakage, epidural fibrosis, need for future surgeries such as revision discectomy and lumbar fusion. I also indicated that this is an operation to basically decompress the nerve root to allow recovery as opposed to fixing a herniated disc or the arthritis and that the incidence of recurrent chest disc herniation can approach 15%. Also that nerve root recovery is variable and may not recover completely.  I discussed the operative course including overnight in the hospital. Immediate ambulation. Follow-up in 2 weeks for suture removal. 6 weeks until healing of the herniation followed by 6 weeks of reconditioning and strengthening of the core musculature. Also discussed the need to employ the concepts of disc pressure management and core motion  following the surgery to minimize the risk of recurrent disc herniation. We will obtain preoperative clearance i if necessary and proceed accordingly.  In this indications basically the decompress and remove the bone spur and the ligamentum flavum in the lateral recess.  With revision surgery we discussed the increased risk of a CSF leak given the scar tissue etc.  Also indications for surgical fusion in the future.  She currently takes gabapentin we discussed titrating for effect  Obtain preoperative clearance  Plan Revision microlumbar decompression L4-5  Cecilie Kicks, PA-C  for Dr. Tonita Cong 11/05/2018, 8:43 AM

## 2018-11-18 NOTE — Pre-Procedure Instructions (Signed)
Katherine Fisher  11/18/2018      [id number] [place], [place] [address] Phone: [phone] Fax: [phone]  [place], [place] AT [facility] OF [place] [address] Phone: [phone] Fax: [phone]  [facility] - [address] [place] [address] Phone: [phone] Fax: [place], [place] [address] Phone: [phone] Fax: [phone]    Your procedure is scheduled on Thurs., November 28, 2018 from 7:30AM-10:00AM  Report to [facility] Entrance "A" at 5:30AM  Call this number if you have problems the morning of surgery:  [phone]   Remember:  Do not eat or drink after midnight on March 18th    Take these medicines the morning of surgery with A SIP OF WATER: Gabapentin (NEURONTIN)  If needed: Levocetirizine (XYZAL) and Fluticasone (FLONASE)   Follow your surgeon's instructions on when to stop Aspirin.  If no instructions were given by your surgeon then you will need to call the office to get those instructions.    7 days before surgery (11/21/18), stop taking all Other Aspirin Products, Vitamins, Fish oils, and Herbal medications. Also stop all NSAIDS i.e. Advil, Ibuprofen, Motrin, Aleve, Anaprox, Naproxen, BC, Goody Powders, and all Supplements.     Do not wear jewelry, make-up or nail polish.  Do not wear lotions, powders, or perfumes, or deodorant.  Do not shave 48 hours prior to surgery.    Do not bring valuables to the hospital.  [facility] is not responsible for any belongings or valuables.  Contacts, dentures or bridgework may not be worn into surgery.  Leave your suitcase in the car.  After surgery it may be brought to your room.  For  patients admitted to the hospital, discharge time will be determined by your treatment team.  Patients discharged the day of surgery will not be allowed to drive home.   Special instructions:  - Preparing For Surgery  Before surgery, you can play an important role. Because skin is not sterile, your skin needs to be as free of germs as possible. You can reduce the number of germs on your skin by washing with CHG (chlorahexidine gluconate) Soap before surgery.  CHG is an antiseptic cleaner which kills germs and bonds with the skin to continue killing germs even after washing.    Oral Hygiene is also important to reduce your risk of infection.  Remember - BRUSH YOUR TEETH THE MORNING OF SURGERY WITH YOUR REGULAR TOOTHPASTE  Please do not use if you have an allergy to CHG or antibacterial soaps. If your skin becomes reddened/irritated stop using the CHG.  Do not shave (including legs and underarms) for at least 48 hours prior to first CHG shower. It is OK to shave your face.  Please follow these instructions carefully.   1. Shower the NIGHT BEFORE SURGERY and the MORNING OF SURGERY with CHG.   2. If you chose to wash your hair, wash your hair first as usual with your normal shampoo.  3. After you shampoo, rinse your hair and body thoroughly to remove the shampoo.  4. Use CHG as you would any other liquid soap. You can apply CHG directly to the skin and wash gently with a scrungie or a clean  washcloth.   5. Apply the CHG Soap to your body ONLY FROM THE NECK DOWN.  Do not use on open wounds or open sores. Avoid contact with your eyes, ears, mouth and genitals (private parts). Wash Face and genitals (private parts)  with your normal soap.  6. Wash thoroughly, paying special attention to the area where your surgery will be performed.  7. Thoroughly rinse your body with warm water from the neck down.  8. DO NOT shower/wash with your normal soap after using and rinsing off the CHG  Soap.  9. Pat yourself dry with a CLEAN TOWEL.  10. Wear CLEAN PAJAMAS to bed the night before surgery, wear comfortable clothes the morning of surgery  11. Place CLEAN SHEETS on your bed the night of your first shower and DO NOT SLEEP WITH PETS.  Day of Surgery:  Do not apply any deodorants/lotions.  Please wear clean clothes to the hospital/surgery center.   Remember to brush your teeth WITH YOUR REGULAR TOOTHPASTE.  Please read over the following fact sheets that you were given. Pain Booklet, Coughing and Deep Breathing, MRSA Information and Surgical Site Infection Prevention

## 2018-11-19 ENCOUNTER — Other Ambulatory Visit: Payer: Self-pay

## 2018-11-19 ENCOUNTER — Ambulatory Visit (HOSPITAL_COMMUNITY)
Admission: RE | Admit: 2018-11-19 | Discharge: 2018-11-19 | Disposition: A | Discharge status: Home / Self Care | Payer: MEDICARE | Source: Ambulatory Visit | Attending: Orthopedic Surgery | Admitting: Orthopedic Surgery

## 2018-11-19 ENCOUNTER — Encounter (HOSPITAL_COMMUNITY): Payer: Self-pay

## 2018-11-19 ENCOUNTER — Encounter (HOSPITAL_COMMUNITY)
Admission: RE | Admit: 2018-11-19 | Discharge: 2018-11-19 | Disposition: A | Discharge status: Home / Self Care | Payer: MEDICARE | Source: Ambulatory Visit | Attending: Specialist | Admitting: Specialist

## 2018-11-19 DIAGNOSIS — M48061 Spinal stenosis, lumbar region without neurogenic claudication: Secondary | ICD-10-CM | POA: Diagnosis not present

## 2018-11-19 DIAGNOSIS — M47816 Spondylosis without myelopathy or radiculopathy, lumbar region: Secondary | ICD-10-CM | POA: Diagnosis not present

## 2018-11-19 HISTORY — DX: Allergic rhinitis due to pollen: J30.1

## 2018-11-19 HISTORY — DX: Family history of other specified conditions: Z84.89

## 2018-11-19 HISTORY — DX: Anemia, unspecified: D64.9

## 2018-11-19 LAB — CBC
HCT: 38.7 % (ref 36.0–46.0)
Hemoglobin: 12 g/dL (ref 12.0–15.0)
MCH: 27.3 pg (ref 26.0–34.0)
MCHC: 31 g/dL (ref 30.0–36.0)
MCV: 88 fL (ref 80.0–100.0)
Platelets: 256 10*3/uL (ref 150–400)
RBC: 4.4 MIL/uL (ref 3.87–5.11)
RDW: 13.2 % (ref 11.5–15.5)
WBC: 7.1 10*3/uL (ref 4.0–10.5)
nRBC: 0 % (ref 0.0–0.2)

## 2018-11-19 LAB — BASIC METABOLIC PANEL
Anion gap: 8 (ref 5–15)
BUN: 16 mg/dL (ref 8–23)
CO2: 25 mmol/L (ref 22–32)
CREATININE: 0.79 mg/dL (ref 0.44–1.00)
Calcium: 9.8 mg/dL (ref 8.9–10.3)
Chloride: 108 mmol/L (ref 98–111)
GFR calc Af Amer: >60 mL/min (ref >60)
GFR calc non Af Amer: >60 mL/min (ref >60)
Glucose, Bld: 90 mg/dL (ref 70–99)
Potassium: 3.9 mmol/L (ref 3.5–5.1)
Sodium: 141 mmol/L (ref 135–145)

## 2018-11-19 LAB — SURGICAL PCR SCREEN
MRSA, PCR: NEGATIVE
Staphylococcus aureus: NEGATIVE

## 2018-11-19 NOTE — Progress Notes (Signed)
PCP -  Dr. Garlon Hatchet  LOV 10/2018   [phone]  Cardiologist - denies  Chest x-ray - denies EKG -  ?  10/2018  ( I have called office)  Stress Test - 2009 ECHO - 2009 Cardiac Cath - denies  Sleep Study - denies CPAP -   Fasting Blood Sugar - n/a Checks Blood Sugar _____ times a day  Blood Thinner Instructions: Aspirin Instructions:  Anesthesia review: n/a  Patient denies shortness of breath, fever, cough and chest pain at PAT appointment   Patient verbalized understanding of instructions that were given to them at the PAT appointment. Patient was also instructed that they will need to review over the PAT instructions again at home before surgery.

## 2018-11-19 NOTE — Pre-Procedure Instructions (Signed)
Katherine Fisher  11/19/2018      [id number] [place], [place] [address] Phone: [phone] Fax: [phone]  [place], [place] - [place] AT [facility] OF [place] [address] Phone: [phone] Fax: [phone]  [facility] - [address] [place] [address] Phone: [phone] Fax: [place], [place] [address] Phone: [phone] Fax: [phone]    Your procedure is scheduled on Thurs., November 28, 2018                 Report to [facility] Entrance "A" at 5:30AM             (from 7:30AM-10:00AM)   Call this number if you have problems the morning of surgery:  [phone]   Remember:  Do not eat ANY foods or drink any liquids after midnight on March 18th    Take these medicines the morning of surgery with A SIP OF WATER: Gabapentin (NEURONTIN)  If needed: Levocetirizine (XYZAL) and Fluticasone (FLONASE)   Follow your surgeon's instructions on when to stop Aspirin.  If no instructions were given by your surgeon then you will need to call the office to get those instructions.    7 days before surgery (11/21/18), stop taking all Other Aspirin Products, Vitamins, Fish oils, and Herbal medications. Also stop all NSAIDS i.e. Advil, Ibuprofen, Motrin, Aleve, Anaprox, Naproxen, BC, Goody Powders, and all Supplements.     Do not wear jewelry, make-up or nail polish.  Do not wear lotions, powders, or perfumes, or deodorant.  Do not shave 48 hours prior to surgery.    Do not bring valuables to the hospital.  [facility] is not responsible for any belongings or valuables.  Contacts, dentures or bridgework may not be worn into surgery.  Leave your suitcase in the car.   After surgery it may be brought to your room.  For patients admitted to the hospital, discharge time will be determined by your treatment team.  Patients discharged the day of surgery will not be allowed to drive home, and will need someone to stay with you for the first 24 hrs.    Special instructions:  [place]- Preparing For Surgery  Before surgery, you can play an important role. Because skin is not sterile, your skin needs to be as free of germs as possible. You can reduce the number of germs on your skin by washing with CHG (chlorahexidine gluconate) Soap before surgery.  CHG is an antiseptic cleaner which kills germs and bonds with the skin to continue killing germs even after washing.    Oral Hygiene is also important to reduce your risk of infection.    Remember - BRUSH YOUR TEETH THE MORNING OF SURGERY WITH YOUR REGULAR TOOTHPASTE  Please do not use if you have an allergy to CHG or antibacterial soaps. If your skin becomes reddened/irritated stop using the CHG.  Do not shave (including legs and underarms) for at least 48 hours prior to first CHG shower. It is OK to shave your face.  Please follow these instructions carefully.   1. Shower the NIGHT BEFORE SURGERY and the MORNING OF SURGERY with CHG.   2. If you chose to wash your hair, wash your hair first as  usual with your normal shampoo.  3. After you shampoo, rinse your hair and body thoroughly to remove the shampoo.  4. Use CHG as you would any other liquid soap. You can apply CHG directly to the skin and wash gently with a scrungie or a clean washcloth.   5. Apply the CHG Soap to your body ONLY FROM THE NECK DOWN.  Do not use on open wounds or open sores. Avoid contact with your eyes, ears, mouth and genitals (private parts). Wash Face and genitals (private parts)  with your normal soap.  6. Wash thoroughly, paying special attention to the area where your surgery will be performed.  7. Thoroughly rinse your body  with warm water from the neck down.  8. DO NOT shower/wash with your normal soap after using and rinsing off the CHG Soap.  9. Pat yourself dry with a CLEAN TOWEL.  10. Wear CLEAN PAJAMAS to bed the night before surgery, wear comfortable clothes the morning of surgery  11. Place CLEAN SHEETS on your bed the night of your first shower and DO NOT SLEEP WITH PETS.  Day of Surgery:  Do not apply any deodorants/lotions.  Please wear clean clothes to the hospital/surgery center.   Remember to brush your teeth WITH YOUR REGULAR TOOTHPASTE.  Please read over the following fact sheets that you were given. Pain Booklet, Coughing and Deep Breathing, MRSA Information and Surgical Site Infection Prevention

## 2018-11-20 ENCOUNTER — Encounter (HOSPITAL_COMMUNITY): Payer: Self-pay | Admitting: Vascular Surgery

## 2018-11-20 NOTE — Anesthesia Preprocedure Evaluation (Deleted)
Anesthesia Evaluation    Airway        Dental   Pulmonary           Cardiovascular hypertension,      Neuro/Psych    GI/Hepatic   Endo/Other    Renal/GU      Musculoskeletal   Abdominal   Peds  Hematology   Anesthesia Other Findings   Reproductive/Obstetrics                             Anesthesia Physical Anesthesia Plan  ASA:   Anesthesia Plan:    Post-op Pain Management:    Induction:   PONV Risk Score and Plan:   Airway Management Planned:   Additional Equipment:   Intra-op Plan:   Post-operative Plan:   Informed Consent:   Plan Discussed with:   Anesthesia Plan Comments: (PAT note written 11/20/2018 by Myra Gianotti, PA-C. )        Anesthesia Quick Evaluation

## 2018-11-20 NOTE — Progress Notes (Signed)
Anesthesia Chart Review:  Case:  [id number] Date/Time:  11/28/18 0715   Procedure:  Revision microlumbar decompression L4-5 (N/A ) - 2.5 hrs   Anesthesia type:  General   Pre-op diagnosis:  Spinal Stenosis   Location:  [facility] OR ROOM 18 / [place] OR   Surgeon:  Susa Day, MD      DISCUSSION: Patient is a 68 year old female scheduled for the above procedure.  History includes never smoker, HTN, GERD, anemia, C3-4 ACDF 2004, L3-4/L4-5 decompression laminectomy 07/25/12.  Patient was seen by PCP Dr. Sabra Heck on 10/15/18 for preoperative evaluation and for BP re-evaluation on 10/21/18. Home average BP was 120/75. EKG showed SR, consider old anterior infarct. Surgical clearance completed by Dr. Sabra Heck.   Patient denied SOB, fever, cough, and chest pain at PAT RN visit. If no acute changes then I would anticipate that she can proceed as planned.   VS: BP (!) 144/75   Pulse 88   Temp (!) 36.4 C   Resp 20   Ht 5\' 5"  (1.651 m)   Wt 70.8 kg   SpO2 98%   BMI 25.96 kg/m   PROVIDERS: Kathyrn Lass, MD is PCP ([place])   LABS: Labs reviewed: Acceptable for surgery. (all labs ordered are listed, but only abnormal results are displayed)  Labs Reviewed  SURGICAL PCR SCREEN  BASIC METABOLIC PANEL  CBC    IMAGES: MRI L-spine 08/18/18: IMPRESSION:  Abnormal MRI scan of the lumbar spine showing prominent facet hypertrophic changes resulting in moderate bilateral foraminal narrowing at L4-5 and mild bilateral foraminal narrowing at L3-4.    EKG: 10/15/18 [place]): SR. Consider old anterior infarct.    CV: Echo/Stress Echo 05/05/08: IMPRESSION: Normal LV systolic function. There is impaired LV relaxation. Mild aortic sclerosis/calcification. Mild mitral regurgitation. Mild tricuspid regurgitation. Normal stress echo. No evidence of ischemia.   Past Medical History:  Diagnosis Date  . Anemia   . Arthritis    lower anemia   . Family history of adverse reaction to  anesthesia    SISTER CAN'T TOLERATE MEDS  . GERD (gastroesophageal reflux disease)    hx of   . Hay fever    SEASONAL  . History of colonic polyps   . HTN (hypertension)   . Osteopenia 05/2017   T score -1.5 FRAX 4%/0.4%    Past Surgical History:  Procedure Laterality Date  . ABDOMINAL HYSTERECTOMY  1991   AGUS  . BREAST BIOPSY  1998   x2 on right   . CHOLECYSTECTOMY  04/28/2004  . LUMBAR LAMINECTOMY/DECOMPRESSION MICRODISCECTOMY  07/25/2012   Procedure: LUMBAR LAMINECTOMY/DECOMPRESSION MICRODISCECTOMY 2 LEVELS;  Surgeon: Johnn Hai, MD;  Location: [facility];  Service: Orthopedics;  Laterality: Left;  Lumbar decompression L3-L4, L4 - L5 on the Left (X-Ray)  . NECK SURGERY  07/20/2003   for pinched nerve  . PARTIAL HYSTERECTOMY  1991    MEDICATIONS: . aspirin 81 MG tablet  . fluticasone (FLONASE) 50 MCG/ACT nasal spray  . gabapentin (NEURONTIN) 300 MG capsule  . hydrochlorothiazide (MICROZIDE) 12.5 MG capsule  . levocetirizine (XYZAL) 5 MG tablet  . lisinopril (PRINIVIL,ZESTRIL) 40 MG tablet  . Multiple Vitamin (MULTIVITAMIN) tablet  . Omega-3 Fatty Acids (FISH OIL) 1000 MG CAPS  . vitamin B-12 (CYANOCOBALAMIN) 1000 MCG tablet  . VITAMIN D PO   No current facility-administered medications for this encounter.   She reports she is holding ASA for surgery.   Myra Gianotti, PA-C Surgical Short Stay/Anesthesiology [facility] Phone [phone]  [facility] Phone [phone] 11/20/2018 1:54 PM

## 2018-11-21 ENCOUNTER — Ambulatory Visit: Payer: Self-pay | Admitting: Orthopedic Surgery

## 2018-11-21 NOTE — H&P (Signed)
Katherine Fisher is an 68 y.o. female.   Chief Complaint: back and left leg pain HPI: Reason for Visit: (normal) visit for: (back); The patient is 6 years out from decompression @ L3-4 and L4-5. Location (Lower Extremity): lower back pain bilateral; leg pain on the left, , Severity: pain level 6/10 Timing: constant Quality: sharp; aching; burning Aggravating Factors: sitting for Associated Symptoms: numbness/tingling (left foot) Medications: helping a little; The patient is taking Gabapentin. Notes: The patient is 2 weeks out from [facility] @ L4-5 left. I reviewed her note from the injection was done at L4-5 on left.  She had relief for an hour or 2 and then the pain returned. Radiates down her calf and through her foot      Past Medical History:  Diagnosis Date  . Arthritis    lower anemia   . GERD (gastroesophageal reflux disease)    hx of   . History of colonic polyps   . HTN (hypertension)   . Osteopenia 05/2017   T score -1.5 FRAX 4%/0.4%         Past Surgical History:  Procedure Laterality Date  . ABDOMINAL HYSTERECTOMY  1991   AGUS  . BREAST BIOPSY  1998   x2 on right   . CHOLECYSTECTOMY  04/28/2004  . LUMBAR LAMINECTOMY/DECOMPRESSION MICRODISCECTOMY  07/25/2012   Procedure: LUMBAR LAMINECTOMY/DECOMPRESSION MICRODISCECTOMY 2 LEVELS;  Surgeon: Johnn Hai, MD;  Location: [facility];  Service: Orthopedics;  Laterality: Left;  Lumbar decompression L3-L4, L4 - L5 on the Left (X-Ray)  . NECK SURGERY  07/20/2003   for pinched nerve  . PARTIAL HYSTERECTOMY  1991         Family History  Problem Relation Age of Onset  . Heart attack Father   . Heart disease Father   . Hypertension Mother   . Breast cancer Mother   . Arthritis Mother   . Stomach cancer Mother   . Breast cancer Maternal Aunt   . Breast cancer Cousin        several cousins   . Hypertension Sister   . Arthritis Sister   . Neuropathy Sister   . Other Sister        H.  Pylori  . Heart disease Paternal Uncle   . Hypertension Sister   . Arthritis Sister   . Heart murmur Sister   . Hypertension Sister   . Arthritis Sister   . Heart murmur Sister   . Hypertension Sister   . Arthritis Sister    Social History:  reports that she has never smoked. She has never used smokeless tobacco. She reports that she does not drink alcohol or use drugs.  Allergies: No Known Allergies  Medications: Baby Aspirin 81 mg chewable tablet Flonase Allergy Relief 50 mcg/actuation nasal spray,suspension gabapentin 300 mg capsule levocetirizine 5 mg tablet lisinopriL 40 mg tablet Multivitamins W/Multi-Minerals Omega 3 Fish Oil predniSONE 5 mg tablets in a dose pack Vitamin B12 2,500 mcg tablet Vitamin D3 50 mcg (2,000 unit) tablet  Review of Systems  Constitutional: Negative.   HENT: Negative.   Eyes: Negative.   Respiratory: Negative.   Cardiovascular: Negative.   Gastrointestinal: Negative.   Genitourinary: Negative.   Musculoskeletal: Positive for back pain.  Skin: Negative.   Neurological: Positive for sensory change and focal weakness.  Psychiatric/Behavioral: Negative.     There were no vitals taken for this visit. Physical Exam  Constitutional: She is oriented to person, place, and time. She appears well-developed and well-nourished.  HENT:  Head: Normocephalic.  Eyes: Pupils are equal, round, and reactive to light.  Neck: Normal range of motion.  Cardiovascular: Normal rate.  Respiratory: Effort normal.  GI: Soft.  Musculoskeletal:     Comments: Patient is a 68 year old female.  Gait and Station: Appearance: ambulating with no assistive devices and antalgic gait.  Constitutional: General Appearance: healthy-appearing and distress (mild).  Psychiatric: Mood and Affect: active and alert.  Cardiovascular System: Edema Right: none; Dorsalis and posterior tibial pulses 2+. Edema Left: none.  Abdomen: Inspection and Palpation:  non-distended and no tenderness.  Skin: Inspection and palpation: no rash.  Lumbar Spine: Inspection: normal alignment. Bony Palpation of the Lumbar Spine: tender at lumbosacral junction.. Bony Palpation of the Right Hip: no tenderness of the greater trochanter and tenderness of the SI joint; Pelvis stable. Bony Palpation of the Left Hip: no tenderness of the greater trochanter and tenderness of the SI joint. Soft Tissue Palpation on the Right: No flank pain with percussion. Active Range of Motion: limited flexion and extention.  Motor Strength: L1 Motor Strength on the Right: hip flexion iliopsoas 5/5. L1 Motor Strength on the Left: hip flexion iliopsoas 5/5. L2-L4 Motor Strength on the Right: knee extension quadriceps 5/5. L2-L4 Motor Strength on the Left: knee extension quadriceps 5/5. L5 Motor Strength on the Right: ankle dorsiflexion tibialis anterior 5/5 and great toe extension extensor hallucis longus 5/5. L5 Motor Strength on the Left: ankle dorsiflexion tibialis anterior 5/5 and great toe extension extensor hallucis longus 4/5. S1 Motor Strength on the Right: plantar flexion gastrocnemius 5/5. S1 Motor Strength on the Left: plantar flexion gastrocnemius 5/5.  Neurological System: Knee Reflex Right: normal (2). Knee Reflex Left: normal (2). Ankle Reflex Right: normal (2). Ankle Reflex Left: normal (2). Babinski Reflex Right: plantar reflex absent. Babinski Reflex Left: plantar reflex absent. Sensation on the Right: normal distal extremities. Sensation on the Left: normal distal extremities. Special Tests on the Right: no clonus of the ankle/knee. Special Tests on the Left: no clonus of the ankle/knee and seated straight leg raising test positive.  Neurological: She is alert and oriented to person, place, and time.  Skin: Skin is warm and dry.    MRI demonstrates lateral recess stenosis at L4-5 with facet hypertrophy.  Temporary relief from an epidural at L4-5 on left.  Assessment/Plan We  discussed options including living with her symptoms with activity modification.  Versus revision microlumbar decompression at L4-5 with decompression of the left lateral recess and the L5 nerve root.  She would like to proceed with the latter  I had an extensive discussion with the patient concerning the pathology relevant anatomy and treatment options. At this point exhausting conservative treatment and in the presence of a neurologic deficit we discussed microlumbar decompression. I discussed the risks and benefits including bleeding, infection, DVT, PE, anesthetic complications, worsening in their symptoms, improvement in their symptoms, C SF leakage, epidural fibrosis, need for future surgeries such as revision discectomy and lumbar fusion. I also indicated that this is an operation to basically decompress the nerve root to allow recovery as opposed to fixing a herniated disc or the arthritis and that the incidence of recurrent chest disc herniation can approach 15%. Also that nerve root recovery is variable and may not recover completely.  I discussed the operative course including overnight in the hospital. Immediate ambulation. Follow-up in 2 weeks for suture removal. 6 weeks until healing of the herniation followed by 6 weeks of reconditioning and strengthening of the core musculature. Also discussed  the need to employ the concepts of disc pressure management and core motion following the surgery to minimize the risk of recurrent disc herniation. We will obtain preoperative clearance i if necessary and proceed accordingly.  In this indications basically the decompress and remove the bone spur and the ligamentum flavum in the lateral recess.  With revision surgery we discussed the increased risk of a CSF leak given the scar tissue etc.  Also indications for surgical fusion in the future.  She currently takes gabapentin we discussed titrating for effect  Obtain preoperative  clearance  Plan Revision microlumbar decompression L4-5  Cecilie Kicks, PA-C for Dr. Tonita Cong 11/21/2018, 4:35 PM

## 2018-12-05 ENCOUNTER — Ambulatory Visit (HOSPITAL_COMMUNITY): Admission: RE | Admit: 2018-12-05 | Payer: MEDICARE | Source: Home / Self Care | Admitting: Specialist

## 2018-12-05 ENCOUNTER — Encounter (HOSPITAL_COMMUNITY): Admission: RE | Payer: Self-pay | Source: Home / Self Care

## 2018-12-05 SURGERY — LUMBAR LAMINECTOMY/DECOMPRESSION MICRODISCECTOMY 1 LEVEL
Anesthesia: General

## 2019-01-23 ENCOUNTER — Ambulatory Visit: Payer: Self-pay | Admitting: Orthopedic Surgery

## 2019-01-23 NOTE — H&P (Signed)
Katherine Fisher is an 68 y.o. female.   Chief Complaint: back and left leg pain HPI: Reason for Visit: (normal) visit for: (back); The patient is 6 years out from decompression @ L3-4 and L4-5. Location (Lower Extremity): lower back pain bilateral; leg pain on the left, , Severity: pain level 6/10 Timing: constant Quality: sharp; aching; burning Aggravating Factors: sitting for Associated Symptoms: numbness/tingling (left foot) Medications: helping a little; The patient is taking Gabapentin. Notes: The patient is 2 weeks out from [facility] @ L4-5 left. I reviewed her note from the injection was done at L4-5 on left.  She had relief for an hour or 2 and then the pain returned. Radiates down her calf and through her foot      Past Medical History:  Diagnosis Date  . Arthritis    lower anemia   . GERD (gastroesophageal reflux disease)    hx of   . History of colonic polyps   . HTN (hypertension)   . Osteopenia 05/2017   T score -1.5 FRAX 4%/0.4%         Past Surgical History:  Procedure Laterality Date  . ABDOMINAL HYSTERECTOMY  1991   AGUS  . BREAST BIOPSY  1998   x2 on right   . CHOLECYSTECTOMY  04/28/2004  . LUMBAR LAMINECTOMY/DECOMPRESSION MICRODISCECTOMY  07/25/2012   Procedure: LUMBAR LAMINECTOMY/DECOMPRESSION MICRODISCECTOMY 2 LEVELS;  Surgeon: Johnn Hai, MD;  Location: [facility];  Service: Orthopedics;  Laterality: Left;  Lumbar decompression L3-L4, L4 - L5 on the Left (X-Ray)  . NECK SURGERY  07/20/2003   for pinched nerve  . PARTIAL HYSTERECTOMY  1991         Family History  Problem Relation Age of Onset  . Heart attack Father   . Heart disease Father   . Hypertension Mother   . Breast cancer Mother   . Arthritis Mother   . Stomach cancer Mother   . Breast cancer Maternal Aunt   . Breast cancer Cousin        several cousins   . Hypertension Sister   . Arthritis Sister   . Neuropathy Sister   . Other Sister        H.  Pylori  . Heart disease Paternal Uncle   . Hypertension Sister   . Arthritis Sister   . Heart murmur Sister   . Hypertension Sister   . Arthritis Sister   . Heart murmur Sister   . Hypertension Sister   . Arthritis Sister    Social History:  reports that she has never smoked. She has never used smokeless tobacco. She reports that she does not drink alcohol or use drugs.  Allergies: No Known Allergies  Medications: Baby Aspirin 81 mg chewable tablet Flonase Allergy Relief 50 mcg/actuation nasal spray,suspension gabapentin 300 mg capsule levocetirizine 5 mg tablet lisinopriL 40 mg tablet Multivitamins W/Multi-Minerals Omega 3 Fish Oil predniSONE 5 mg tablets in a dose pack Vitamin B12 2,500 mcg tablet Vitamin D3 50 mcg (2,000 unit) tablet  Review of Systems  Constitutional: Negative.   HENT: Negative.   Eyes: Negative.   Respiratory: Negative.   Cardiovascular: Negative.   Gastrointestinal: Negative.   Genitourinary: Negative.   Musculoskeletal: Positive for back pain.  Skin: Negative.   Neurological: Positive for sensory change and focal weakness.  Psychiatric/Behavioral: Negative.     There were no vitals taken for this visit. Physical Exam  Constitutional: She is oriented to person, place, and time. She appears well-developed and well-nourished.  HENT:  Head: Normocephalic.  Eyes: Pupils are equal, round, and reactive to light.  Neck: Normal range of motion.  Cardiovascular: Normal rate.  Respiratory: Effort normal.  GI: Soft.  Musculoskeletal:     Comments: Patient is a 68 year old female.  Gait and Station: Appearance: ambulating with no assistive devices and antalgic gait.  Constitutional: General Appearance: healthy-appearing and distress (mild).  Psychiatric: Mood and Affect: active and alert.  Cardiovascular System: Edema Right: none; Dorsalis and posterior tibial pulses 2+. Edema Left: none.  Abdomen: Inspection and Palpation:  non-distended and no tenderness.  Skin: Inspection and palpation: no rash.  Lumbar Spine: Inspection: normal alignment. Bony Palpation of the Lumbar Spine: tender at lumbosacral junction.. Bony Palpation of the Right Hip: no tenderness of the greater trochanter and tenderness of the SI joint; Pelvis stable. Bony Palpation of the Left Hip: no tenderness of the greater trochanter and tenderness of the SI joint. Soft Tissue Palpation on the Right: No flank pain with percussion. Active Range of Motion: limited flexion and extention.  Motor Strength: L1 Motor Strength on the Right: hip flexion iliopsoas 5/5. L1 Motor Strength on the Left: hip flexion iliopsoas 5/5. L2-L4 Motor Strength on the Right: knee extension quadriceps 5/5. L2-L4 Motor Strength on the Left: knee extension quadriceps 5/5. L5 Motor Strength on the Right: ankle dorsiflexion tibialis anterior 5/5 and great toe extension extensor hallucis longus 5/5. L5 Motor Strength on the Left: ankle dorsiflexion tibialis anterior 5/5 and great toe extension extensor hallucis longus 4/5. S1 Motor Strength on the Right: plantar flexion gastrocnemius 5/5. S1 Motor Strength on the Left: plantar flexion gastrocnemius 5/5.  Neurological System: Knee Reflex Right: normal (2). Knee Reflex Left: normal (2). Ankle Reflex Right: normal (2). Ankle Reflex Left: normal (2). Babinski Reflex Right: plantar reflex absent. Babinski Reflex Left: plantar reflex absent. Sensation on the Right: normal distal extremities. Sensation on the Left: normal distal extremities. Special Tests on the Right: no clonus of the ankle/knee. Special Tests on the Left: no clonus of the ankle/knee and seated straight leg raising test positive.  Neurological: She is alert and oriented to person, place, and time.  Skin: Skin is warm and dry.    MRI demonstrates lateral recess stenosis at L4-5 with facet hypertrophy.  Temporary relief from an epidural at L4-5 on left.  Assessment/Plan We  discussed options including living with her symptoms with activity modification.  Versus revision microlumbar decompression at L4-5 with decompression of the left lateral recess and the L5 nerve root.  She would like to proceed with the latter  I had an extensive discussion with the patient concerning the pathology relevant anatomy and treatment options. At this point exhausting conservative treatment and in the presence of a neurologic deficit we discussed microlumbar decompression. I discussed the risks and benefits including bleeding, infection, DVT, PE, anesthetic complications, worsening in their symptoms, improvement in their symptoms, C SF leakage, epidural fibrosis, need for future surgeries such as revision discectomy and lumbar fusion. I also indicated that this is an operation to basically decompress the nerve root to allow recovery as opposed to fixing a herniated disc or the arthritis and that the incidence of recurrent chest disc herniation can approach 15%. Also that nerve root recovery is variable and may not recover completely.  I discussed the operative course including overnight in the hospital. Immediate ambulation. Follow-up in 2 weeks for suture removal. 6 weeks until healing of the herniation followed by 6 weeks of reconditioning and strengthening of the core musculature. Also discussed  the need to employ the concepts of disc pressure management and core motion following the surgery to minimize the risk of recurrent disc herniation. We will obtain preoperative clearance i if necessary and proceed accordingly.  In this indications basically the decompress and remove the bone spur and the ligamentum flavum in the lateral recess.  With revision surgery we discussed the increased risk of a CSF leak given the scar tissue etc.  Also indications for surgical fusion in the future.  She currently takes gabapentin we discussed titrating for effect  Obtain preoperative  clearance  Plan Revision microlumbar decompression L4-5  Cecilie Kicks, PA-C for Dr. Tonita Cong 01/23/2019 4:33PM

## 2019-02-13 ENCOUNTER — Ambulatory Visit (HOSPITAL_COMMUNITY): Admission: RE | Admit: 2019-02-13 | Payer: MEDICARE | Source: Home / Self Care | Admitting: Specialist

## 2019-02-13 ENCOUNTER — Encounter (HOSPITAL_COMMUNITY): Admission: RE | Payer: Self-pay | Source: Home / Self Care

## 2019-02-13 SURGERY — LUMBAR LAMINECTOMY/DECOMPRESSION MICRODISCECTOMY 1 LEVEL
Anesthesia: General

## 2019-02-17 DIAGNOSIS — H25813 Combined forms of age-related cataract, bilateral: Secondary | ICD-10-CM | POA: Diagnosis not present

## 2019-02-17 DIAGNOSIS — H5203 Hypermetropia, bilateral: Secondary | ICD-10-CM | POA: Diagnosis not present

## 2019-02-17 DIAGNOSIS — H52203 Unspecified astigmatism, bilateral: Secondary | ICD-10-CM | POA: Diagnosis not present

## 2019-03-22 DIAGNOSIS — Z20828 Contact with and (suspected) exposure to other viral communicable diseases: Secondary | ICD-10-CM | POA: Diagnosis not present

## 2019-04-01 ENCOUNTER — Other Ambulatory Visit: Payer: Self-pay | Admitting: Family Medicine

## 2019-04-01 DIAGNOSIS — Z1231 Encounter for screening mammogram for malignant neoplasm of breast: Secondary | ICD-10-CM

## 2019-05-14 DIAGNOSIS — Z23 Encounter for immunization: Secondary | ICD-10-CM | POA: Diagnosis not present

## 2019-05-26 ENCOUNTER — Other Ambulatory Visit: Payer: Self-pay

## 2019-05-26 ENCOUNTER — Ambulatory Visit
Admission: RE | Admit: 2019-05-26 | Discharge: 2019-05-26 | Disposition: A | Discharge status: Home / Self Care | Payer: MEDICARE | Source: Ambulatory Visit | Attending: Family Medicine | Admitting: Family Medicine

## 2019-05-26 DIAGNOSIS — Z1231 Encounter for screening mammogram for malignant neoplasm of breast: Secondary | ICD-10-CM

## 2019-05-27 ENCOUNTER — Encounter: Payer: Self-pay | Admitting: Gynecology

## 2019-05-27 ENCOUNTER — Ambulatory Visit (INDEPENDENT_AMBULATORY_CARE_PROVIDER_SITE_OTHER): Payer: MEDICARE | Admitting: Gynecology

## 2019-05-27 VITALS — BP 118/74 | Ht 64.0 in | Wt 152.0 lb

## 2019-05-27 DIAGNOSIS — N952 Postmenopausal atrophic vaginitis: Secondary | ICD-10-CM

## 2019-05-27 DIAGNOSIS — M8580 Other specified disorders of bone density and structure, unspecified site: Secondary | ICD-10-CM

## 2019-05-27 DIAGNOSIS — Z1272 Encounter for screening for malignant neoplasm of vagina: Secondary | ICD-10-CM | POA: Diagnosis not present

## 2019-05-27 DIAGNOSIS — Z01419 Encounter for gynecological examination (general) (routine) without abnormal findings: Secondary | ICD-10-CM | POA: Diagnosis not present

## 2019-05-27 NOTE — Patient Instructions (Signed)
Follow-up for the bone density as scheduled. 

## 2019-05-27 NOTE — Addendum Note (Signed)
Addended by: Nelva Nay on: 05/27/2019 10:41 AM   Modules accepted: Orders

## 2019-05-27 NOTE — Progress Notes (Signed)
    Katherine Fisher [birth year] [id number]        68 y.o.  G2P2 for breast and pelvic exam.  Without gynecologic complaints  Past medical history,surgical history, problem list, medications, allergies, family history and social history were all reviewed and documented as reviewed in the EPIC chart.  ROS:  Performed with pertinent positives and negatives included in the history, assessment and plan.   Additional significant findings : None   Exam: Caryn Bee assistant Vitals:   05/27/19 1003  BP: 118/74  Weight: 152 lb (68.9 kg)  Height: 5\' 4"  (1.626 m)   Body mass index is 26.09 kg/m.  General appearance:  Normal affect, orientation and appearance. Skin: Grossly normal HEENT: Without gross lesions.  No cervical or supraclavicular adenopathy. Thyroid normal.  Lungs:  Clear without wheezing, rales or rhonchi Cardiac: RR, without RMG Abdominal:  Soft, nontender, without masses, guarding, rebound, organomegaly or hernia Breasts:  Examined lying and sitting without masses, retractions, discharge or axillary adenopathy. Pelvic:  Ext, BUS, Vagina: With atrophic changes.  Pap smear of cuff done  Adnexa: Without masses or tenderness    Anus and perineum: Normal   Rectovaginal: Normal sphincter tone without palpated masses or tenderness.    Assessment/Plan:  68 y.o. G2P2 female for breast and pelvic exam.  Status post TAH LSO for endometriosis and atypical glandular cells.  1. Postmenopausal.  Without menopausal symptoms.  Had been on Premarin vaginal cream but discontinued and is doing well without this. 2. Pap smear 2017.  Pap smear done today due to history of atypical glandular cells previously.  All Pap smears since her hysterectomy have been normal. 3. Mammography yesterday normal.  Breast exam normal today. 4. Colonoscopy 2013.  Repeat at their recommended interval. 5. Osteopenia.  DEXA 2018 T score -1.5 FRAX 4% / 0.4%.  Recommend DEXA now at 2-year interval and she will  schedule in follow-up for this. 6. Health maintenance.  No routine lab work done as patient does this elsewhere.  Follow-up 1 year, sooner as needed.   [name] MD, 10:26 AM 05/27/2019

## 2019-05-28 LAB — PAP IG W/ RFLX HPV ASCU

## 2019-06-11 ENCOUNTER — Other Ambulatory Visit: Payer: Self-pay

## 2019-06-12 ENCOUNTER — Ambulatory Visit (INDEPENDENT_AMBULATORY_CARE_PROVIDER_SITE_OTHER): Payer: MEDICARE

## 2019-06-12 DIAGNOSIS — Z780 Asymptomatic menopausal state: Secondary | ICD-10-CM | POA: Diagnosis not present

## 2019-06-12 DIAGNOSIS — M8589 Other specified disorders of bone density and structure, multiple sites: Secondary | ICD-10-CM

## 2019-06-12 DIAGNOSIS — M8580 Other specified disorders of bone density and structure, unspecified site: Secondary | ICD-10-CM

## 2019-06-13 ENCOUNTER — Other Ambulatory Visit: Payer: Self-pay | Admitting: Gynecology

## 2019-06-13 ENCOUNTER — Encounter: Payer: Self-pay | Admitting: Gynecology

## 2019-06-13 DIAGNOSIS — Z780 Asymptomatic menopausal state: Secondary | ICD-10-CM

## 2019-06-13 DIAGNOSIS — M8589 Other specified disorders of bone density and structure, multiple sites: Secondary | ICD-10-CM

## 2019-06-18 ENCOUNTER — Encounter: Payer: Self-pay | Admitting: Gynecology

## 2019-07-12 DIAGNOSIS — D649 Anemia, unspecified: Secondary | ICD-10-CM | POA: Diagnosis not present

## 2019-07-12 DIAGNOSIS — I10 Essential (primary) hypertension: Secondary | ICD-10-CM | POA: Diagnosis not present

## 2019-07-12 DIAGNOSIS — M47896 Other spondylosis, lumbar region: Secondary | ICD-10-CM | POA: Diagnosis not present

## 2019-09-02 ENCOUNTER — Other Ambulatory Visit (HOSPITAL_BASED_OUTPATIENT_CLINIC_OR_DEPARTMENT_OTHER): Payer: Self-pay | Admitting: Family Medicine

## 2019-09-02 DIAGNOSIS — R1032 Left lower quadrant pain: Secondary | ICD-10-CM

## 2019-09-03 ENCOUNTER — Other Ambulatory Visit: Payer: Self-pay

## 2019-09-03 ENCOUNTER — Ambulatory Visit (HOSPITAL_BASED_OUTPATIENT_CLINIC_OR_DEPARTMENT_OTHER)
Admission: RE | Admit: 2019-09-03 | Discharge: 2019-09-03 | Disposition: A | Discharge status: Home / Self Care | Payer: MEDICARE | Source: Ambulatory Visit | Attending: Family Medicine | Admitting: Family Medicine

## 2019-09-03 ENCOUNTER — Encounter (HOSPITAL_BASED_OUTPATIENT_CLINIC_OR_DEPARTMENT_OTHER): Payer: Self-pay

## 2019-09-03 DIAGNOSIS — R1032 Left lower quadrant pain: Secondary | ICD-10-CM | POA: Diagnosis not present

## 2019-09-03 MED ORDER — IOHEXOL 350 MG/ML SOLN: 100.0000 mL | Freq: Once | INTRAVENOUS | Status: DC | PRN

## 2019-09-03 MED ORDER — IOHEXOL 300 MG/ML  SOLN
100.0000 mL | Freq: Once | INTRAMUSCULAR | Status: AC | PRN
  Administered 2019-09-03: 100 mL via INTRAVENOUS

## 2019-10-02 DIAGNOSIS — Z23 Encounter for immunization: Secondary | ICD-10-CM | POA: Diagnosis not present

## 2019-10-21 DIAGNOSIS — M25552 Pain in left hip: Secondary | ICD-10-CM | POA: Diagnosis not present

## 2019-10-21 DIAGNOSIS — M545 Low back pain: Secondary | ICD-10-CM | POA: Diagnosis not present

## 2019-10-21 DIAGNOSIS — M5136 Other intervertebral disc degeneration, lumbar region: Secondary | ICD-10-CM | POA: Diagnosis not present

## 2019-10-27 DIAGNOSIS — Z23 Encounter for immunization: Secondary | ICD-10-CM | POA: Diagnosis not present

## 2019-10-28 DIAGNOSIS — M25552 Pain in left hip: Secondary | ICD-10-CM | POA: Diagnosis not present

## 2019-11-04 DIAGNOSIS — M961 Postlaminectomy syndrome, not elsewhere classified: Secondary | ICD-10-CM | POA: Diagnosis not present

## 2019-11-04 DIAGNOSIS — M48061 Spinal stenosis, lumbar region without neurogenic claudication: Secondary | ICD-10-CM | POA: Diagnosis not present

## 2019-11-04 DIAGNOSIS — M4316 Spondylolisthesis, lumbar region: Secondary | ICD-10-CM | POA: Diagnosis not present

## 2019-11-04 DIAGNOSIS — M545 Low back pain: Secondary | ICD-10-CM | POA: Diagnosis not present

## 2019-11-04 DIAGNOSIS — M5136 Other intervertebral disc degeneration, lumbar region: Secondary | ICD-10-CM | POA: Diagnosis not present

## 2020-02-06 DIAGNOSIS — D649 Anemia, unspecified: Secondary | ICD-10-CM | POA: Diagnosis not present

## 2020-02-06 DIAGNOSIS — I10 Essential (primary) hypertension: Secondary | ICD-10-CM | POA: Diagnosis not present

## 2020-02-06 DIAGNOSIS — M47896 Other spondylosis, lumbar region: Secondary | ICD-10-CM | POA: Diagnosis not present

## 2020-02-24 DIAGNOSIS — H5203 Hypermetropia, bilateral: Secondary | ICD-10-CM | POA: Diagnosis not present

## 2020-02-24 DIAGNOSIS — H25813 Combined forms of age-related cataract, bilateral: Secondary | ICD-10-CM | POA: Diagnosis not present

## 2020-03-19 ENCOUNTER — Other Ambulatory Visit: Payer: Self-pay | Admitting: Family Medicine

## 2020-03-19 DIAGNOSIS — Z1231 Encounter for screening mammogram for malignant neoplasm of breast: Secondary | ICD-10-CM

## 2020-03-22 DIAGNOSIS — I10 Essential (primary) hypertension: Secondary | ICD-10-CM | POA: Diagnosis not present

## 2020-03-22 DIAGNOSIS — D649 Anemia, unspecified: Secondary | ICD-10-CM | POA: Diagnosis not present

## 2020-03-22 DIAGNOSIS — M47896 Other spondylosis, lumbar region: Secondary | ICD-10-CM | POA: Diagnosis not present

## 2020-04-20 DIAGNOSIS — Z20822 Contact with and (suspected) exposure to covid-19: Secondary | ICD-10-CM | POA: Diagnosis not present

## 2020-05-26 ENCOUNTER — Ambulatory Visit
Admission: RE | Admit: 2020-05-26 | Discharge: 2020-05-26 | Disposition: A | Discharge status: Home / Self Care | Payer: MEDICARE | Source: Ambulatory Visit | Attending: Family Medicine | Admitting: Family Medicine

## 2020-05-26 ENCOUNTER — Other Ambulatory Visit: Payer: Self-pay

## 2020-05-26 DIAGNOSIS — Z1231 Encounter for screening mammogram for malignant neoplasm of breast: Secondary | ICD-10-CM | POA: Diagnosis not present

## 2020-05-27 ENCOUNTER — Ambulatory Visit (INDEPENDENT_AMBULATORY_CARE_PROVIDER_SITE_OTHER): Payer: MEDICARE | Admitting: Obstetrics and Gynecology

## 2020-05-27 ENCOUNTER — Encounter: Payer: Self-pay | Admitting: Obstetrics and Gynecology

## 2020-05-27 VITALS — BP 124/82 | Ht 64.0 in | Wt 153.0 lb

## 2020-05-27 DIAGNOSIS — Z01419 Encounter for gynecological examination (general) (routine) without abnormal findings: Secondary | ICD-10-CM | POA: Diagnosis not present

## 2020-05-27 DIAGNOSIS — M8580 Other specified disorders of bone density and structure, unspecified site: Secondary | ICD-10-CM

## 2020-05-27 NOTE — Progress Notes (Signed)
   Katherine Fisher 09-25-1950 [id number]  SUBJECTIVE:  69 y.o. G2P2 female here for a breast and pelvic exam. She has no gynecologic concerns.  Current Outpatient Medications  Medication Sig Dispense Refill  . aspirin 81 MG tablet Take 81 mg by mouth daily.    . fluticasone (FLONASE) 50 MCG/ACT nasal spray Place 2 sprays into the nose daily as needed for allergies. allergies    . hydrochlorothiazide (MICROZIDE) 12.5 MG capsule Take 12.5 mg by mouth daily.    Marland Kitchen levocetirizine (XYZAL) 5 MG tablet Take 5 mg by mouth daily as needed for allergies. As needed    . lisinopril (PRINIVIL,ZESTRIL) 40 MG tablet Take 40 mg by mouth daily.    . Multiple Vitamin (MULTIVITAMIN) tablet Take 1 tablet by mouth daily.    . Omega-3 Fatty Acids (FISH OIL) 1000 MG CAPS Take 1,000 mg by mouth daily.    . vitamin B-12 (CYANOCOBALAMIN) 1000 MCG tablet Take 1,000 mcg by mouth daily.    Marland Kitchen VITAMIN D PO Take 2,000 Int'l Units by mouth daily.     No current facility-administered medications for this visit.   Allergies: Patient has no known allergies.  No LMP recorded. Patient has had a hysterectomy.  Past medical history,surgical history, problem list, medications, allergies, family history and social history were all reviewed and documented as reviewed in the EPIC chart.  GYN ROS: no abnormal bleeding, pelvic pain or discharge, no breast pain or new or enlarging lumps on self exam.  No dysuria, frequency, burning, pain with urination, cloudy/malodorous urine.   OBJECTIVE:  Ht 5\' 4"  (1.626 m)   Wt 153 lb (69.4 kg)   BMI 26.26 kg/m  The patient appears well, alert, oriented, in no distress.  BREAST EXAM: breasts appear normal, no suspicious masses, no skin or nipple changes or axillary nodes  PELVIC EXAM: VULVA: normal appearing vulva with atrophic changes, no masses, tenderness or lesions, VAGINA: normal appearing vagina with atrophic changes, normal color and discharge, no lesions, CERVIX: surgically  absent, UTERUS: surgically absent, vaginal cuff normal, ADNEXA: no masses, nontender  Chaperone: Aurora Mask (DNP student) present during the examination and performed the pelvic exam with me in attendance to confirm the exam findings   ASSESSMENT:  69 y.o. G2P2 here for a breast and pelvic exam  PLAN:   1. Postmenopausal. Prior TAH LSO for endometriosis and atypical glandular cells.  Continue Premarin vaginal cream and doing well without use of HRT. 2. Pap smear 05/2019.  Prior history of atypical glandular cells as above, all Pap smears since hysterectomy have been normal.  Next Pap smear due 2023. 3. Mammogram done yesterday.  Dictation pending.  Normal breast exam today.  She will continue with annual mammograms. 4. Colonoscopy 2012.  She will follow up at the interval recommended by her GI specialist.   5.  Osteopenia.  DEXA 06/2019.  T score -1.6, FRAX 4.5% / 0.6%.  Taking vitamin D and calcium, staying physically active.  Next DEXA recommended 2022.  6. Health maintenance.  No labs today as she normally has these completed with her primary care doctor.  Return annually or sooner, prn.  Joseph Pierini MD [birth year]

## 2020-06-18 DIAGNOSIS — Z23 Encounter for immunization: Secondary | ICD-10-CM | POA: Diagnosis not present

## 2020-06-21 DIAGNOSIS — M47896 Other spondylosis, lumbar region: Secondary | ICD-10-CM | POA: Diagnosis not present

## 2020-06-21 DIAGNOSIS — I10 Essential (primary) hypertension: Secondary | ICD-10-CM | POA: Diagnosis not present

## 2020-06-21 DIAGNOSIS — D649 Anemia, unspecified: Secondary | ICD-10-CM | POA: Diagnosis not present

## 2020-07-01 DIAGNOSIS — Z23 Encounter for immunization: Secondary | ICD-10-CM | POA: Diagnosis not present

## 2020-08-27 DIAGNOSIS — Z6825 Body mass index (BMI) 25.0-25.9, adult: Secondary | ICD-10-CM | POA: Diagnosis not present

## 2020-08-27 DIAGNOSIS — I10 Essential (primary) hypertension: Secondary | ICD-10-CM | POA: Diagnosis not present

## 2020-08-27 DIAGNOSIS — J309 Allergic rhinitis, unspecified: Secondary | ICD-10-CM | POA: Diagnosis not present

## 2020-08-27 DIAGNOSIS — D649 Anemia, unspecified: Secondary | ICD-10-CM | POA: Diagnosis not present

## 2020-08-27 DIAGNOSIS — Z0000 Encounter for general adult medical examination without abnormal findings: Secondary | ICD-10-CM | POA: Diagnosis not present

## 2020-08-27 DIAGNOSIS — M47896 Other spondylosis, lumbar region: Secondary | ICD-10-CM | POA: Diagnosis not present

## 2020-11-24 IMAGING — MG DIGITAL SCREENING BILAT W/ TOMO W/ CAD
8 series · 8 of 24 positions shown · non-contrast
Comparison: Previous exam(s).

CLINICAL DATA: Screening.

EXAM:
DIGITAL SCREENING BILATERAL MAMMOGRAM WITH TOMO AND CAD

[L CC synth-2D]
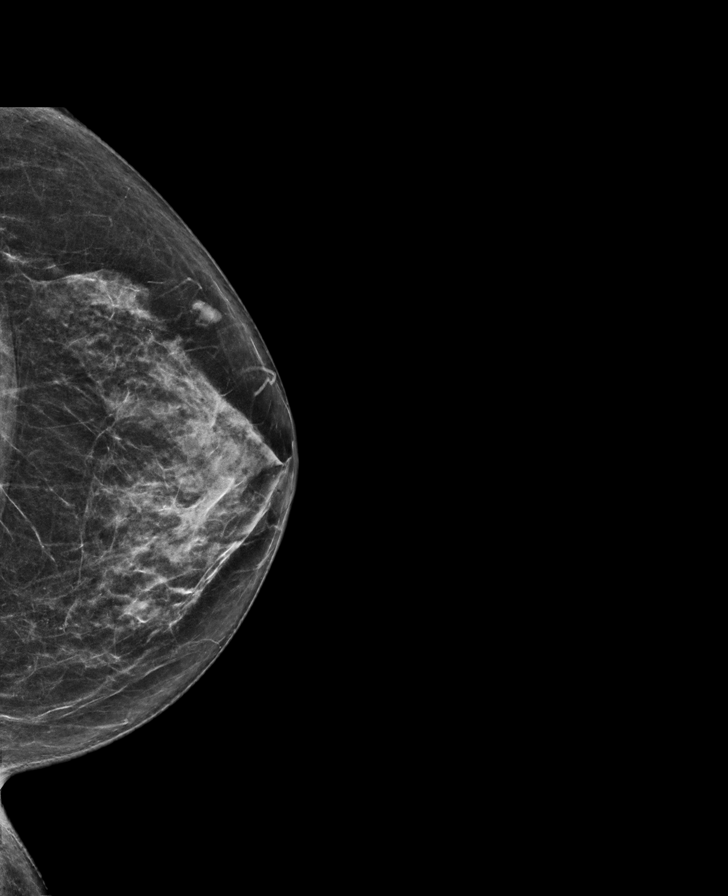

[L MLO synth-2D]
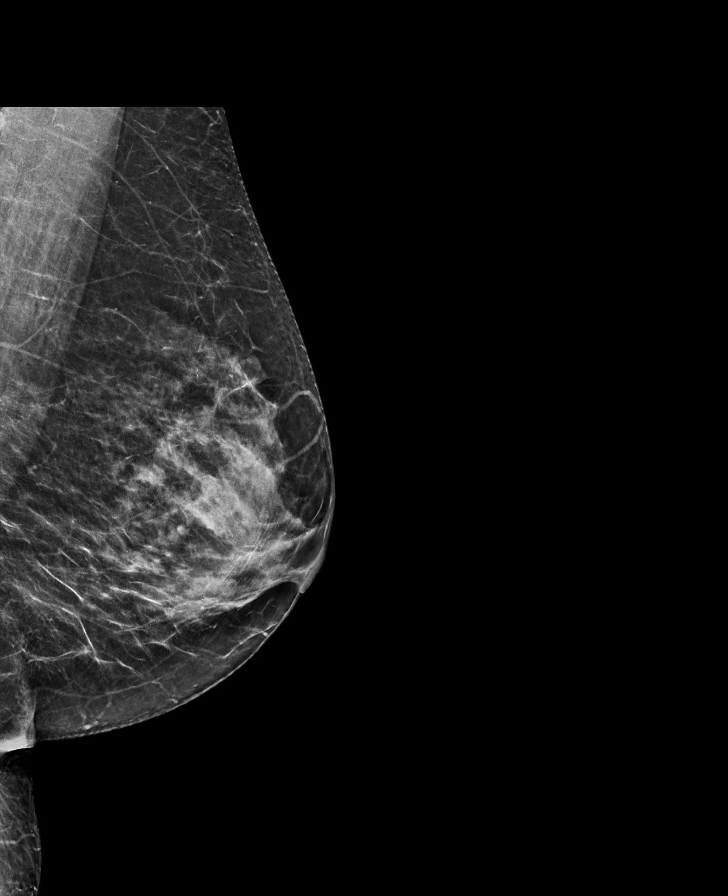

[R CC synth-2D]
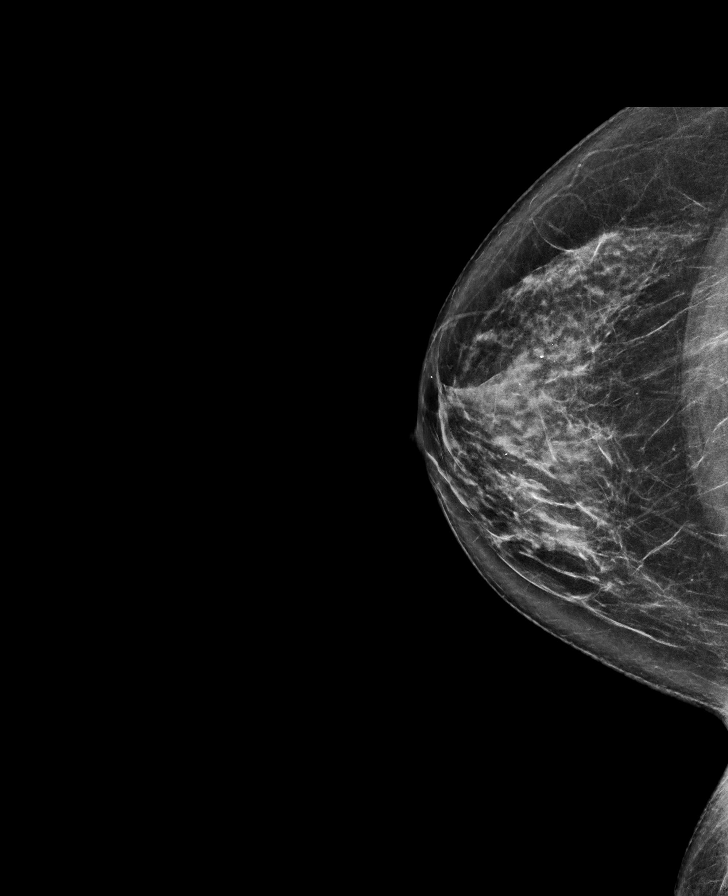

[R MLO synth-2D]
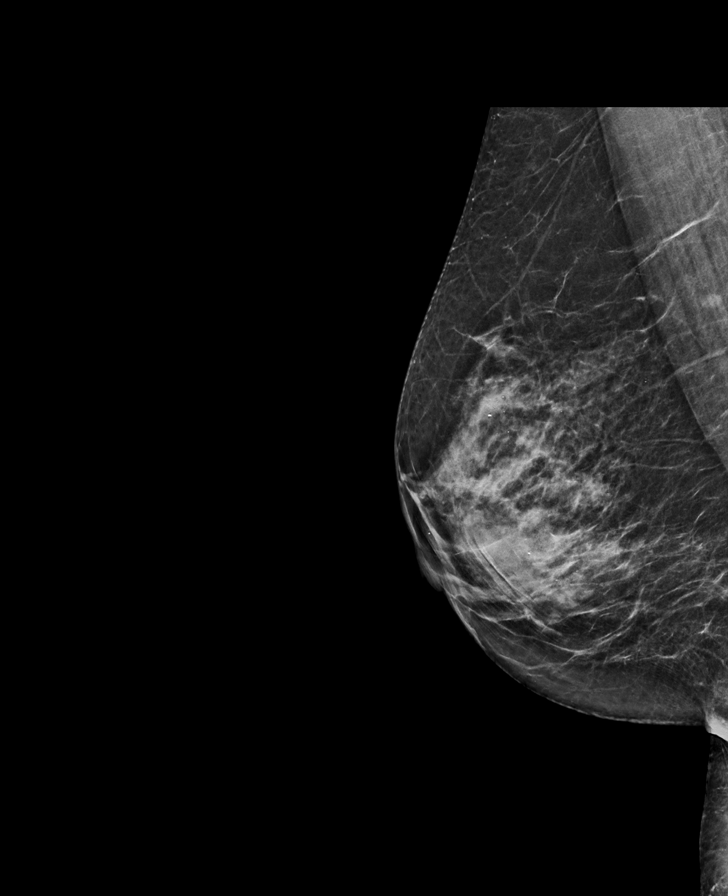

[R CC tomo · tomo slice 37/72.0]
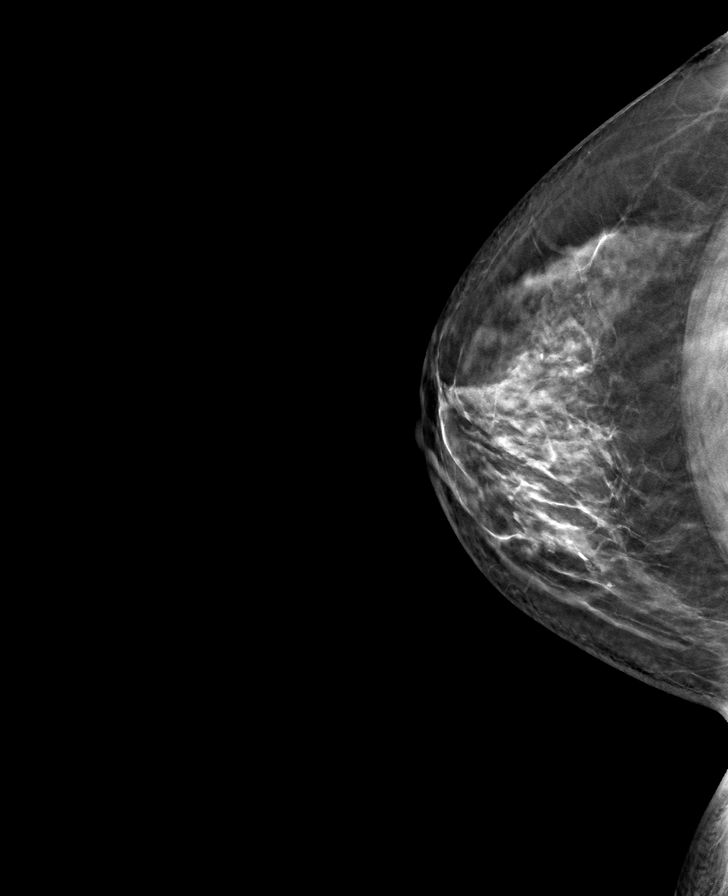

[L CC tomo · tomo slice 35/68.0]
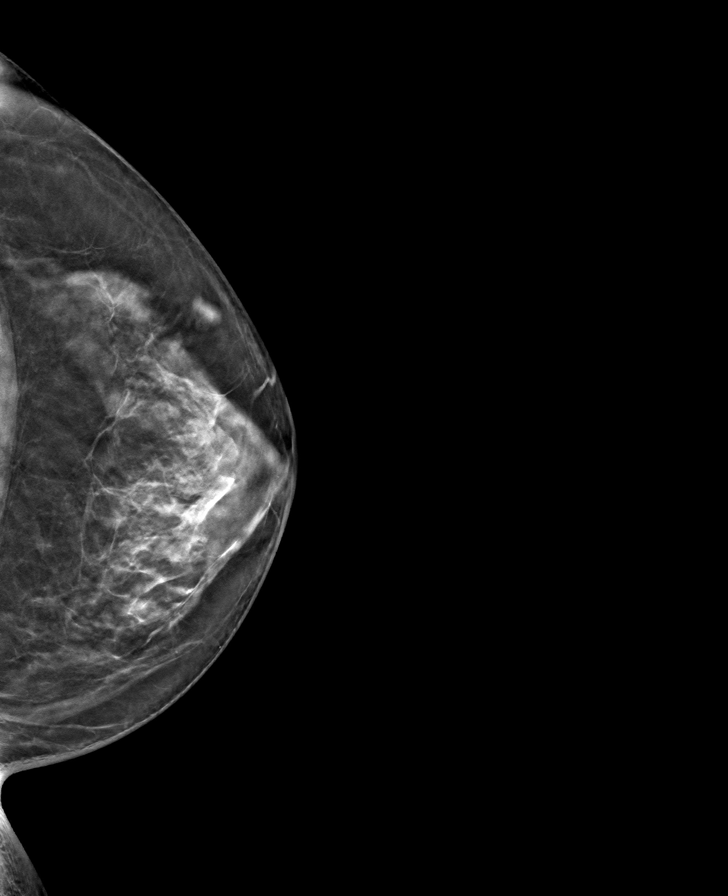

[R MLO tomo · tomo slice 32/63.0]
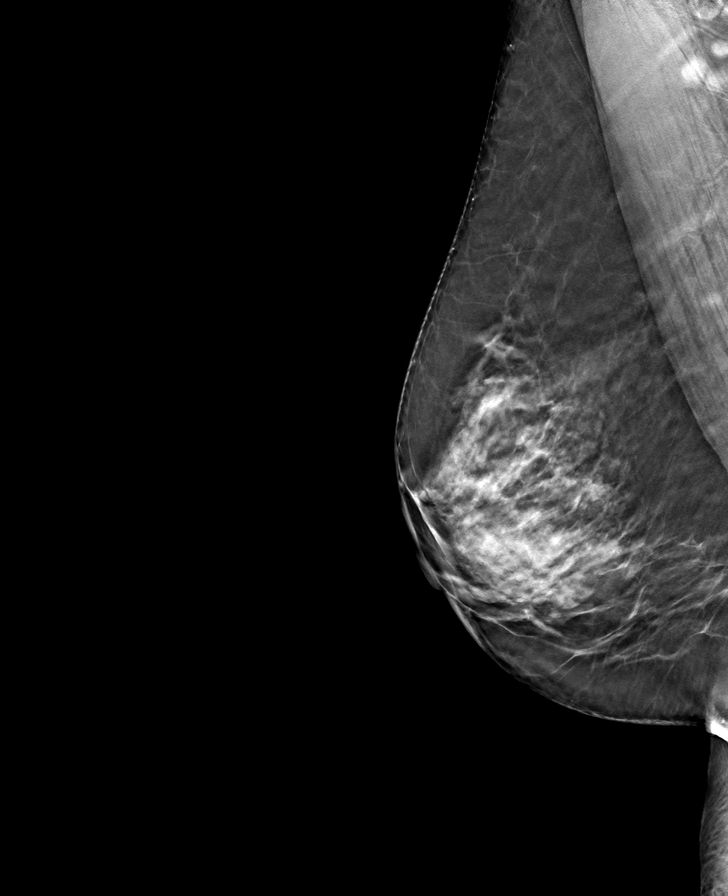

[L MLO tomo · tomo slice 33/65.0]
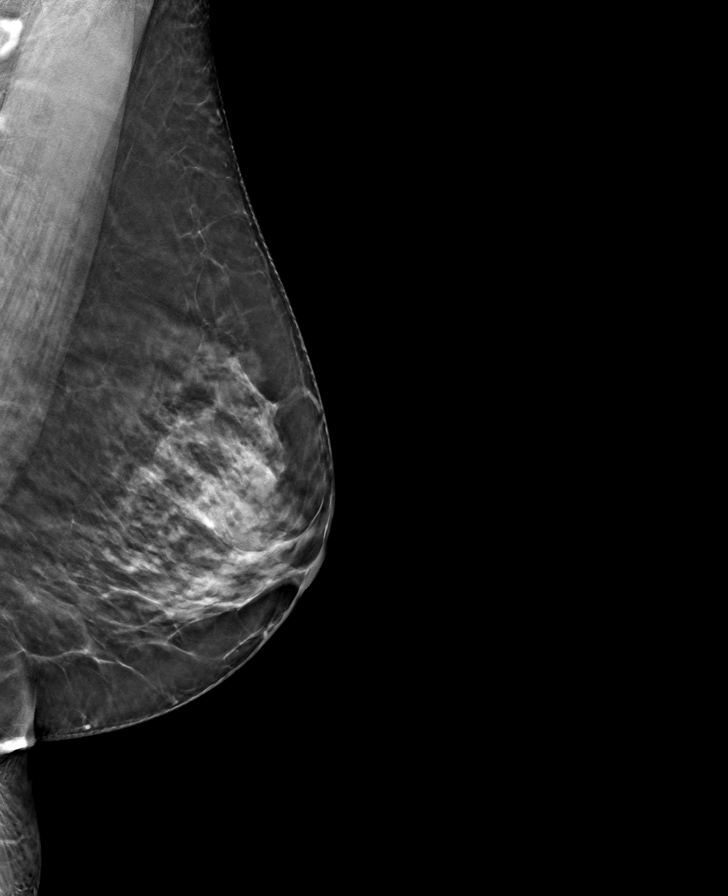

[8 of 24 positions shown; findings below may reference images not displayed]

ACR Breast Density Category c: The breast tissue is heterogeneously
dense, which may obscure small masses.
FINDINGS: There are no findings suspicious for malignancy. Images were
processed with CAD.
IMPRESSION: No mammographic evidence of malignancy. A result letter of this
screening mammogram will be mailed directly to the patient.

RECOMMENDATION:
Screening mammogram in one year. (Code:FT-U-LHB)

BI-RADS CATEGORY  1: Negative.

## 2020-11-25 DIAGNOSIS — D649 Anemia, unspecified: Secondary | ICD-10-CM | POA: Diagnosis not present

## 2020-11-25 DIAGNOSIS — M47896 Other spondylosis, lumbar region: Secondary | ICD-10-CM | POA: Diagnosis not present

## 2020-11-25 DIAGNOSIS — I10 Essential (primary) hypertension: Secondary | ICD-10-CM | POA: Diagnosis not present

## 2020-12-21 ENCOUNTER — Other Ambulatory Visit: Payer: Self-pay | Admitting: Family Medicine

## 2020-12-21 DIAGNOSIS — L818 Other specified disorders of pigmentation: Secondary | ICD-10-CM | POA: Diagnosis not present

## 2020-12-21 DIAGNOSIS — Z1231 Encounter for screening mammogram for malignant neoplasm of breast: Secondary | ICD-10-CM

## 2020-12-21 DIAGNOSIS — L820 Inflamed seborrheic keratosis: Secondary | ICD-10-CM | POA: Diagnosis not present

## 2020-12-21 DIAGNOSIS — D2261 Melanocytic nevi of right upper limb, including shoulder: Secondary | ICD-10-CM | POA: Diagnosis not present

## 2020-12-29 DIAGNOSIS — I10 Essential (primary) hypertension: Secondary | ICD-10-CM | POA: Diagnosis not present

## 2020-12-29 DIAGNOSIS — M47896 Other spondylosis, lumbar region: Secondary | ICD-10-CM | POA: Diagnosis not present

## 2020-12-29 DIAGNOSIS — D649 Anemia, unspecified: Secondary | ICD-10-CM | POA: Diagnosis not present

## 2021-02-15 DIAGNOSIS — I10 Essential (primary) hypertension: Secondary | ICD-10-CM | POA: Diagnosis not present

## 2021-02-15 DIAGNOSIS — D649 Anemia, unspecified: Secondary | ICD-10-CM | POA: Diagnosis not present

## 2021-02-15 DIAGNOSIS — M47896 Other spondylosis, lumbar region: Secondary | ICD-10-CM | POA: Diagnosis not present

## 2021-02-17 DIAGNOSIS — H9209 Otalgia, unspecified ear: Secondary | ICD-10-CM | POA: Diagnosis not present

## 2021-02-28 DIAGNOSIS — H52203 Unspecified astigmatism, bilateral: Secondary | ICD-10-CM | POA: Diagnosis not present

## 2021-02-28 DIAGNOSIS — H5203 Hypermetropia, bilateral: Secondary | ICD-10-CM | POA: Diagnosis not present

## 2021-02-28 DIAGNOSIS — H25813 Combined forms of age-related cataract, bilateral: Secondary | ICD-10-CM | POA: Diagnosis not present

## 2021-03-08 DIAGNOSIS — H9201 Otalgia, right ear: Secondary | ICD-10-CM | POA: Diagnosis not present

## 2021-05-18 DIAGNOSIS — Z23 Encounter for immunization: Secondary | ICD-10-CM | POA: Diagnosis not present

## 2021-05-27 ENCOUNTER — Other Ambulatory Visit: Payer: Self-pay

## 2021-05-27 ENCOUNTER — Ambulatory Visit
Admission: RE | Admit: 2021-05-27 | Discharge: 2021-05-27 | Disposition: A | Discharge status: Home / Self Care | Payer: MEDICARE | Source: Ambulatory Visit | Attending: Family Medicine | Admitting: Family Medicine

## 2021-05-27 DIAGNOSIS — Z1231 Encounter for screening mammogram for malignant neoplasm of breast: Secondary | ICD-10-CM | POA: Diagnosis not present

## 2021-05-28 ENCOUNTER — Emergency Department (HOSPITAL_BASED_OUTPATIENT_CLINIC_OR_DEPARTMENT_OTHER): Payer: MEDICARE

## 2021-05-28 ENCOUNTER — Emergency Department (HOSPITAL_BASED_OUTPATIENT_CLINIC_OR_DEPARTMENT_OTHER)
Admission: EM | Admit: 2021-05-28 | Discharge: 2021-05-28 | Disposition: A | Discharge status: Home / Self Care | Payer: MEDICARE | Attending: Emergency Medicine | Admitting: Emergency Medicine

## 2021-05-28 ENCOUNTER — Encounter (HOSPITAL_BASED_OUTPATIENT_CLINIC_OR_DEPARTMENT_OTHER): Payer: Self-pay | Admitting: Emergency Medicine

## 2021-05-28 ENCOUNTER — Other Ambulatory Visit: Payer: Self-pay

## 2021-05-28 DIAGNOSIS — R110 Nausea: Secondary | ICD-10-CM | POA: Insufficient documentation

## 2021-05-28 DIAGNOSIS — Z7982 Long term (current) use of aspirin: Secondary | ICD-10-CM | POA: Diagnosis not present

## 2021-05-28 DIAGNOSIS — Z79899 Other long term (current) drug therapy: Secondary | ICD-10-CM | POA: Insufficient documentation

## 2021-05-28 DIAGNOSIS — R1032 Left lower quadrant pain: Secondary | ICD-10-CM

## 2021-05-28 DIAGNOSIS — I10 Essential (primary) hypertension: Secondary | ICD-10-CM | POA: Insufficient documentation

## 2021-05-28 DIAGNOSIS — R1084 Generalized abdominal pain: Secondary | ICD-10-CM | POA: Diagnosis not present

## 2021-05-28 DIAGNOSIS — R109 Unspecified abdominal pain: Secondary | ICD-10-CM | POA: Diagnosis not present

## 2021-05-28 LAB — CBC WITH DIFFERENTIAL/PLATELET
Abs Immature Granulocytes: 0 10*3/uL (ref 0.00–0.07)
Basophils Absolute: 0 10*3/uL (ref 0.0–0.1)
Basophils Relative: 0 %
Eosinophils Absolute: 0.1 10*3/uL (ref 0.0–0.5)
Eosinophils Relative: 2 %
HCT: 37.9 % (ref 36.0–46.0)
Hemoglobin: 12.1 g/dL (ref 12.0–15.0)
Immature Granulocytes: 0 %
Lymphocytes Relative: 41 %
Lymphs Abs: 2.5 10*3/uL (ref 0.7–4.0)
MCH: 27.6 pg (ref 26.0–34.0)
MCHC: 31.9 g/dL (ref 30.0–36.0)
MCV: 86.5 fL (ref 80.0–100.0)
Monocytes Absolute: 0.4 10*3/uL (ref 0.1–1.0)
Monocytes Relative: 7 %
Neutro Abs: 3.1 10*3/uL (ref 1.7–7.7)
Neutrophils Relative %: 50 %
Platelets: 290 10*3/uL (ref 150–400)
RBC: 4.38 MIL/uL (ref 3.87–5.11)
RDW: 13.3 % (ref 11.5–15.5)
WBC: 6.1 10*3/uL (ref 4.0–10.5)
nRBC: 0 % (ref 0.0–0.2)

## 2021-05-28 LAB — COMPREHENSIVE METABOLIC PANEL
ALT: 14 U/L (ref 0–44)
AST: 18 U/L (ref 15–41)
Albumin: 4.1 g/dL (ref 3.5–5.0)
Alkaline Phosphatase: 92 U/L (ref 38–126)
Anion gap: 8 (ref 5–15)
BUN: 13 mg/dL (ref 8–23)
CO2: 28 mmol/L (ref 22–32)
Calcium: 10 mg/dL (ref 8.9–10.3)
Chloride: 102 mmol/L (ref 98–111)
Creatinine, Ser: 0.77 mg/dL (ref 0.44–1.00)
GFR, Estimated: >60 mL/min (ref >60)
Glucose, Bld: 89 mg/dL (ref 70–99)
Potassium: 4 mmol/L (ref 3.5–5.1)
Sodium: 138 mmol/L (ref 135–145)
Total Bilirubin: 0.5 mg/dL (ref 0.3–1.2)
Total Protein: 7.8 g/dL (ref 6.5–8.1)

## 2021-05-28 LAB — URINALYSIS, ROUTINE W REFLEX MICROSCOPIC
Bilirubin Urine: NEGATIVE
Glucose, UA: NEGATIVE mg/dL
Hgb urine dipstick: NEGATIVE
Ketones, ur: NEGATIVE mg/dL
Leukocytes,Ua: NEGATIVE
Nitrite: NEGATIVE
Protein, ur: NEGATIVE mg/dL
Specific Gravity, Urine: 1.01 (ref 1.005–1.030)
pH: 8 (ref 5.0–8.0)

## 2021-05-28 LAB — LIPASE, BLOOD: Lipase: 20 U/L (ref 11–51)

## 2021-05-28 MED ORDER — IOHEXOL 350 MG/ML SOLN
80.0000 mL | Freq: Once | INTRAVENOUS | Status: AC | PRN
  Administered 2021-05-28: 80 mL via INTRAVENOUS

## 2021-05-28 NOTE — ED Notes (Signed)
Patient transported to CT 

## 2021-05-28 NOTE — ED Triage Notes (Signed)
Pt reports LT side abd pain, radiating to back; started as a "catch" Wed, worsened last night

## 2021-05-28 NOTE — ED Provider Notes (Signed)
[facility]PART[facility] Provider Note  CSN: [id number] Arrival date & time: 05/28/21 1208    History Chief Complaint  Patient presents with   Abdominal Pain    Katherine Fisher is a 70 y.o. female reports she has had some LLQ and L flank pain for the last few days, began as a 'catch' on her side but has been more persistent since. She has had some nausea, but no vomiting. No diarrhea, constipation or dysuria. She was seen at the [facility] clinic today and sent ot the ED for evaluation of possible diverticulitis.    Past Medical History:  Diagnosis Date   Anemia    Arthritis    lower anemia    Family history of adverse reaction to anesthesia    SISTER CAN'T TOLERATE MEDS   GERD (gastroesophageal reflux disease)    hx of    Hay fever    SEASONAL   History of colonic polyps    HTN (hypertension)    Osteopenia 06/2019   T score -1.6 FRAX 4.5% / 0.6%.  Stable from prior DEXA    Past Surgical History:  Procedure Laterality Date   ABDOMINAL HYSTERECTOMY  1991   AGUS   BREAST BIOPSY  1998   x2 on right    CHOLECYSTECTOMY  04/28/2004   LUMBAR LAMINECTOMY/DECOMPRESSION MICRODISCECTOMY  07/25/2012   Procedure: LUMBAR LAMINECTOMY/DECOMPRESSION MICRODISCECTOMY 2 LEVELS;  Surgeon: Johnn Hai, MD;  Location: [facility];  Service: Orthopedics;  Laterality: Left;  Lumbar decompression L3-L4, L4 - L5 on the Left (X-Ray)   NECK SURGERY  07/20/2003   for pinched nerve   PARTIAL HYSTERECTOMY  1991    Family History  Problem Relation Age of Onset   Heart attack Father    Heart disease Father    Hypertension Mother    Breast cancer Mother    Arthritis Mother    Stomach cancer Mother    Breast cancer Maternal Aunt    Breast cancer Cousin        several cousins    Hypertension Sister    Arthritis Sister    Neuropathy Sister    Other Sister        H. Pylori   Heart disease Paternal Uncle    Hypertension Sister    Arthritis Sister    Heart murmur  Sister    Hypertension Sister    Arthritis Sister    Heart murmur Sister    Hypertension Sister    Arthritis Sister     Social History   Tobacco Use   Smoking status: Never   Smokeless tobacco: Never  Vaping Use   Vaping Use: Never used  Substance Use Topics   Alcohol use: No    Alcohol/week: 0.0 standard drinks   Drug use: No     Home Medications Prior to Admission medications   Medication Sig Start Date End Date Taking? Authorizing Provider  aspirin 81 MG tablet Take 81 mg by mouth daily.    [provider]  fluticasone (FLONASE) 50 MCG/ACT nasal spray Place 2 sprays into the nose daily as needed for allergies. allergies    [provider]  hydrochlorothiazide (MICROZIDE) 12.5 MG capsule Take 12.5 mg by mouth daily.    [provider]  levocetirizine (XYZAL) 5 MG tablet Take 5 mg by mouth daily as needed for allergies. As needed    [provider]  lisinopril (PRINIVIL,ZESTRIL) 40 MG tablet Take 40 mg by mouth daily.    [provider]  Multiple Vitamin (MULTIVITAMIN) tablet Take 1 tablet by mouth daily.    [provider]  Omega-3 Fatty Acids (FISH OIL) 1000 MG CAPS Take 1,000 mg by mouth daily.    [provider]  vitamin B-12 (CYANOCOBALAMIN) 1000 MCG tablet Take 1,000 mcg by mouth daily.    [provider]  VITAMIN D PO Take 2,000 Int'l Units by mouth daily.    [provider]     Allergies    Patient has no known allergies.   Review of Systems   Review of Systems A comprehensive review of systems was completed and negative except as noted in HPI.    Physical Exam BP (!) 155/79 (BP Location: Left Arm)   Pulse 61   Temp 98.3 F (36.8 C) (Oral)   Resp 16   Ht '5\' 5"'$  (1.651 m)   Wt 68 kg   SpO2 99%   BMI 24.96 kg/m   Physical Exam Vitals and nursing note reviewed.  Constitutional:      Appearance: Normal appearance.  HENT:     Head: Normocephalic and atraumatic.     Nose:  Nose normal.     Mouth/Throat:     Mouth: Mucous membranes are moist.  Eyes:     Extraocular Movements: Extraocular movements intact.     Conjunctiva/sclera: Conjunctivae normal.  Cardiovascular:     Rate and Rhythm: Normal rate.  Pulmonary:     Effort: Pulmonary effort is normal.     Breath sounds: Normal breath sounds.  Abdominal:     General: Abdomen is flat.     Palpations: Abdomen is soft.     Tenderness: There is abdominal tenderness in the left lower quadrant. There is no guarding. Negative signs include Murphy's sign and McBurney's sign.  Musculoskeletal:        General: No swelling. Normal range of motion.     Cervical back: Neck supple.  Skin:    General: Skin is warm and dry.  Neurological:     General: No focal deficit present.     Mental Status: She is alert.  Psychiatric:        Mood and Affect: Mood normal.     ED Results / Procedures / Treatments   Labs (all labs ordered are listed, but only abnormal results are displayed) Labs Reviewed  CBC WITH DIFFERENTIAL/PLATELET  COMPREHENSIVE METABOLIC PANEL  LIPASE, BLOOD  URINALYSIS, ROUTINE W REFLEX MICROSCOPIC    EKG None   Radiology CT Abdomen Pelvis W Contrast  Result Date: 05/28/2021 CLINICAL DATA:  Progressive left-sided abdominal pain starting about 3 days ago but worsening EXAM: CT ABDOMEN AND PELVIS WITH CONTRAST TECHNIQUE: Multidetector CT imaging of the abdomen and pelvis was performed using the standard protocol following bolus administration of intravenous contrast. CONTRAST:  54m OMNIPAQUE IOHEXOL 350 MG/ML SOLN COMPARISON:  09/03/2019 FINDINGS: Lower chest: Linear subsegmental atelectasis or scarring in the posterior basal segment right lower lobe. Hepatobiliary: Cholecystectomy.  Otherwise unremarkable. Pancreas: Unremarkable Spleen: Unremarkable Adrenals/Urinary Tract: Unremarkable Stomach/Bowel: Normal appendix. No significant diverticulosis. There is nondistention of most of the descending and  sigmoid colon resulting in appearance of mild wall thickening but there is no surrounding inflammatory stranding to specifically indicate that this is pathologic. A bowel cause for the patient's progressive left-sided abdominal pain is not observed. Vascular/Lymphatic: Minimal abdominal aortic atherosclerotic vascular calcification. Reproductive: Uterus absent.  Adnexa unremarkable. Other: No supplemental non-categorized findings. Musculoskeletal: Small umbilical hernia contains adipose tissue. Lumbar spondylosis and degenerative disc disease cause  mild left foraminal impingement at L3-4 and borderline left foraminal impingement at L4-5, as well as borderline right foraminal impingement at L3-4 and L4-5. IMPRESSION: 1. A definite cause for the patient's left abdominal pain is not identified. The slightly thickened appearance of the wall in the descending and sigmoid colon is attributed to nondistention rather than inflammation given the lack of surrounding inflammatory findings. 2. Mild left foraminal impingement at L4-5. 3.  Aortic Atherosclerosis (ICD10-I70.0). 4. Small umbilical hernia contains adipose tissue. Electronically Signed   By: Van Clines M.D.   On: 05/28/2021 14:38    Procedures Procedures  Medications Ordered in the ED Medications  iohexol (OMNIPAQUE) 350 MG/ML injection 80 mL (80 mLs Intravenous Contrast Given 05/28/21 1346)     MDM Rules/Calculators/A&P MDM Patient with mild LLQ pain, abdominal exam is benign. Check labs and send for CT. Patient declines pain/nausea meds.   ED Course  I have reviewed the triage vital signs and the nursing notes.  Pertinent labs & imaging results that were available during my care of the patient were reviewed by me and considered in my medical decision making (see chart for details).  Clinical Course as of 05/28/21 1447  Sat May 28, 2021  1407 CBC, CMP, Lipase and UA are all normal.  [CS]  1444 CT results reviewed, no concerning  findings. Plan discharge with PCP follow up. Recommend Motrin/APAP as needed for pain. RTED for any other concerns.  [CS]    Clinical Course User Index [CS] Truddie Hidden, MD    Final Clinical Impression(s) / ED Diagnoses Final diagnoses:  Left lower quadrant abdominal pain    Rx / DC Orders ED Discharge Orders     None        Truddie Hidden, MD 05/28/21 1447

## 2021-06-01 NOTE — Progress Notes (Signed)
GYNECOLOGY  VISIT   HPI: 70 y.o.   Married  [place] American  female   G2P2 with No LMP recorded. Patient has had a hysterectomy.   here for  breast and pelvic exam.   Patient seen in urgent care and ER last week for left sided pain and left flank pain. Had neg urinalysis and neg CT ABD/Pelvis. Adnexa unremarkable. Umbilical hernia. Normal appendix. Sigmoid and descending colon with mild thickening.  Urinalysis normal on 05/28/21.  She went fishing yesterday and the pain increased again.   Stopped using vaginal estrogen cream due to some discomfort in her left leg.  Not sexually active.  No dryness or vaginal irritation.   She is also followed for osteopenia of the bilateral hips.  Normal spine.  Last BMDreviewed.   Daughter marrying in October.   GYNECOLOGIC HISTORY: No LMP recorded. Patient has had a hysterectomy. Contraception: Hyst/LSO Menopausal hormone therapy:  none Last mammogram: 05-27-21 3D/Neg/BiRads1 Last pap smear: 05-27-19 Neg, 05-19-16 Neg, 05-19-15 Neg        OB History     Gravida  2   Para  2   Term      Preterm      AB      Living  2      SAB      IAB      Ectopic      Multiple      Live Births                 Patient Active Problem List   Diagnosis Date Noted   Lumbar radiculopathy 08/14/2018   HTN (hypertension) 05/07/2013   Personal history of colonic polyps 05/07/2013   Osteopenia 05/07/2013   Lumbar foraminal stenosis 07/26/2012    Past Medical History:  Diagnosis Date   Anemia    Arthritis    lower anemia    Family history of adverse reaction to anesthesia    SISTER CAN'T TOLERATE MEDS   GERD (gastroesophageal reflux disease)    hx of    Hay fever    SEASONAL   History of colonic polyps    HTN (hypertension)    Osteopenia 06/2019   T score -1.6 FRAX 4.5% / 0.6%.  Stable from prior DEXA    Past Surgical History:  Procedure Laterality Date   ABDOMINAL HYSTERECTOMY  1991   AGUS   BREAST BIOPSY  1998   x2 on right     CHOLECYSTECTOMY  04/28/2004   LUMBAR LAMINECTOMY/DECOMPRESSION MICRODISCECTOMY  07/25/2012   Procedure: LUMBAR LAMINECTOMY/DECOMPRESSION MICRODISCECTOMY 2 LEVELS;  Surgeon: Johnn Hai, MD;  Location: [facility];  Service: Orthopedics;  Laterality: Left;  Lumbar decompression L3-L4, L4 - L5 on the Left (X-Ray)   NECK SURGERY  07/20/2003   for pinched nerve   PARTIAL HYSTERECTOMY  1991    Current Outpatient Medications  Medication Sig Dispense Refill   fluticasone (FLONASE) 50 MCG/ACT nasal spray Place 2 sprays into the nose daily as needed for allergies. allergies     hydrochlorothiazide (MICROZIDE) 12.5 MG capsule Take 12.5 mg by mouth daily.     levocetirizine (XYZAL) 5 MG tablet Take 5 mg by mouth daily as needed for allergies. As needed     lisinopril (PRINIVIL,ZESTRIL) 40 MG tablet Take 40 mg by mouth daily.     lisinopril (ZESTRIL) 40 MG tablet lisinopril 40 mg tablet     montelukast (SINGULAIR) 10 MG tablet      Multiple Vitamin (MULTIVITAMIN) tablet Take 1 tablet  by mouth daily.     Omega-3 Fatty Acids (FISH OIL) 1000 MG CAPS Take 1,000 mg by mouth daily.     vitamin B-12 (CYANOCOBALAMIN) 1000 MCG tablet Take 1,000 mcg by mouth daily.     VITAMIN D PO Take 2,000 Int'l Units by mouth daily.     No current facility-administered medications for this visit.     ALLERGIES: Patient has no known allergies.  Family History  Problem Relation Age of Onset   Heart attack Father    Heart disease Father    Hypertension Mother    Breast cancer Mother    Arthritis Mother    Stomach cancer Mother    Breast cancer Maternal Aunt    Breast cancer Cousin        several cousins    Hypertension Sister    Arthritis Sister    Neuropathy Sister    Other Sister        H. Pylori   Heart disease Paternal Uncle    Hypertension Sister    Arthritis Sister    Heart murmur Sister    Hypertension Sister    Arthritis Sister    Heart murmur Sister    Hypertension Sister    Arthritis Sister      Social History   Socioeconomic History   Marital status: Married    Spouse name: Not on file   Number of children: 2   Years of education: Not on file   Highest education level: High school graduate  Occupational History   Not on file  Tobacco Use   Smoking status: Never   Smokeless tobacco: Never  Vaping Use   Vaping Use: Never used  Substance and Sexual Activity   Alcohol use: No    Alcohol/week: 0.0 standard drinks   Drug use: No   Sexual activity: Not Currently    Birth control/protection: Surgical    Comment: DECLINED INSURANCE QUESTIONS  Other Topics Concern   Not on file  Social History Narrative   Lives at home with her husband   Right handed   Caffeine: 1 soda per week   Social Determinants of Health   Financial Resource Strain: Not on file  Food Insecurity: Not on file  Transportation Needs: Not on file  Physical Activity: Not on file  Stress: Not on file  Social Connections: Not on file  Intimate Partner Violence: Not on file    Review of Systems  All other systems reviewed and are negative.  PHYSICAL EXAMINATION:    BP 110/76   Pulse 76   Ht 5' 4.5" (1.638 m)   Wt 148 lb (67.1 kg)   SpO2 98%   BMI 25.01 kg/m     General appearance: alert, cooperative and appears stated age Head: Normocephalic, without obvious abnormality, atraumatic Neck: no adenopathy, supple, symmetrical, trachea midline and thyroid normal to inspection and palpation Lungs: clear to auscultation bilaterally Breasts: normal appearance, no masses or tenderness, No nipple retraction or dimpling, No nipple discharge or bleeding, No axillary or supraclavicular adenopathy Heart: regular rate and rhythm Abdomen: soft, non-tender, no masses,  no organomegaly Extremities: extremities normal, atraumatic, no cyanosis or edema Skin: Skin color, texture, turgor normal. No rashes or lesions Lymph nodes: Cervical, supraclavicular, and axillary nodes normal. No abnormal inguinal nodes  palpated Neurologic: Grossly normal  Pelvic: External genitalia:  no lesions              Urethra:  normal appearing urethra with no masses, tenderness or lesions  Bartholins and Skenes: normal                 Vagina: normal appearing vagina with normal color and discharge, no lesions              Cervix: absent                Bimanual Exam:  Uterus:  absent              Adnexa: no mass, fullness, tenderness              Rectal exam: yes.  Confirms.              Anus:  normal sphincter tone, no lesions  Chaperone was present for exam:  Raquel Sarna, RN  ASSESSMENT  Status post TAH/LSO for atypical glandular cells.   Vaginal atrophy.  Using Premarin cream.  Osteopenia.  FH breast cancer in mother, maternal aunt and cousins.  Left sided abdominal pain.  Uncertain etiology.  Not gynecologic in origin.  PLAN  Pap and HR HPV testing.  Ok to stop vaginal estrogen cream. Referral to genetic counseling and testing. Yearly mammogram.  Self breast exam encouraged.  Bone density this year.  I recommend she see her PCP in follow up to her ER visit and left sided pain.    An After Visit Summary was printed and given to the patient.  31 min  total time was spent for this patient encounter, including preparation, face-to-face counseling with the patient, coordination of care, and documentation of the encounter.

## 2021-06-02 ENCOUNTER — Telehealth: Payer: Self-pay | Admitting: Genetic Counselor

## 2021-06-02 ENCOUNTER — Other Ambulatory Visit: Payer: Self-pay

## 2021-06-02 ENCOUNTER — Encounter: Payer: MEDICARE | Admitting: Obstetrics and Gynecology

## 2021-06-02 ENCOUNTER — Ambulatory Visit (INDEPENDENT_AMBULATORY_CARE_PROVIDER_SITE_OTHER): Payer: MEDICARE | Admitting: Obstetrics and Gynecology

## 2021-06-02 ENCOUNTER — Other Ambulatory Visit (HOSPITAL_COMMUNITY)
Admission: RE | Admit: 2021-06-02 | Discharge: 2021-06-02 | Disposition: A | Discharge status: Home / Self Care | Payer: MEDICARE | Source: Ambulatory Visit | Attending: Obstetrics and Gynecology | Admitting: Obstetrics and Gynecology

## 2021-06-02 ENCOUNTER — Encounter: Payer: Self-pay | Admitting: Obstetrics and Gynecology

## 2021-06-02 VITALS — BP 110/76 | HR 76 | Ht 64.5 in | Wt 148.0 lb

## 2021-06-02 DIAGNOSIS — Z01419 Encounter for gynecological examination (general) (routine) without abnormal findings: Secondary | ICD-10-CM

## 2021-06-02 DIAGNOSIS — Z803 Family history of malignant neoplasm of breast: Secondary | ICD-10-CM

## 2021-06-02 DIAGNOSIS — R109 Unspecified abdominal pain: Secondary | ICD-10-CM

## 2021-06-02 DIAGNOSIS — M8580 Other specified disorders of bone density and structure, unspecified site: Secondary | ICD-10-CM

## 2021-06-02 NOTE — Patient Instructions (Signed)

## 2021-06-02 NOTE — Telephone Encounter (Signed)
Scheduled appt per 9/22 referral. Pt is aware of appt date and time.

## 2021-06-03 ENCOUNTER — Other Ambulatory Visit: Payer: Self-pay | Admitting: Obstetrics and Gynecology

## 2021-06-03 DIAGNOSIS — R109 Unspecified abdominal pain: Secondary | ICD-10-CM | POA: Diagnosis not present

## 2021-06-03 DIAGNOSIS — M8580 Other specified disorders of bone density and structure, unspecified site: Secondary | ICD-10-CM

## 2021-06-03 LAB — CYTOLOGY - PAP
Comment: NEGATIVE
Diagnosis: NEGATIVE
High risk HPV: NEGATIVE

## 2021-06-08 ENCOUNTER — Other Ambulatory Visit: Payer: Self-pay

## 2021-06-08 ENCOUNTER — Other Ambulatory Visit: Payer: Self-pay | Admitting: Genetic Counselor

## 2021-06-08 ENCOUNTER — Encounter: Payer: Self-pay | Admitting: Genetic Counselor

## 2021-06-08 ENCOUNTER — Inpatient Hospital Stay: Payer: MEDICARE

## 2021-06-08 ENCOUNTER — Inpatient Hospital Stay: Payer: MEDICARE | Attending: Genetic Counselor | Admitting: Genetic Counselor

## 2021-06-08 DIAGNOSIS — Z803 Family history of malignant neoplasm of breast: Secondary | ICD-10-CM

## 2021-06-08 DIAGNOSIS — Z800 Family history of malignant neoplasm of digestive organs: Secondary | ICD-10-CM

## 2021-06-08 DIAGNOSIS — Z808 Family history of malignant neoplasm of other organs or systems: Secondary | ICD-10-CM | POA: Diagnosis not present

## 2021-06-08 DIAGNOSIS — Z8052 Family history of malignant neoplasm of bladder: Secondary | ICD-10-CM | POA: Diagnosis not present

## 2021-06-08 DIAGNOSIS — Z86010 Personal history of colonic polyps: Secondary | ICD-10-CM | POA: Diagnosis not present

## 2021-06-08 LAB — GENETIC SCREENING ORDER

## 2021-06-08 NOTE — Progress Notes (Signed)
REFERRING PROVIDER: Nunzio Cobbs, MD [address] [place],   [address]  PRIMARY PROVIDER:  Kathyrn Lass, MD  PRIMARY REASON FOR VISIT:  No diagnosis found.   HISTORY OF PRESENT ILLNESS:   [name], a 70 y.o. female, was seen for a [place] cancer genetics consultation at the request of Dr. Yisroel Ramming due to a family history of breast and colon cancer.  Ms. Gibeau presents to clinic today to discuss the possibility of a hereditary predisposition to cancer, genetic testing, and to further clarify her future cancer risks, as well as potential cancer risks for family members.   [name] is a 70 y.o. female with no personal history of cancer.    CANCER HISTORY:  Oncology History   No history exists.     RISK FACTORS:  Menarche was at age 58.  First live birth at age 28.  OCP use for approximately 0 years.  Ovaries intact: one.  Hysterectomy: yes.  Menopausal status: postmenopausal.  HRT use: 0 years. Colonoscopy: yes; <5 polyps. Mammogram within the last year: yes. Number of breast biopsies: 1. Up to date with pelvic exams: yes. Any excessive radiation exposure in the past: no  Past Medical History:  Diagnosis Date   Anemia    Arthritis    lower anemia    Family history of adverse reaction to anesthesia    SISTER CAN'T TOLERATE MEDS   Family history of bladder cancer    Family history of breast cancer    Family history of colon cancer    GERD (gastroesophageal reflux disease)    hx of    Hay fever    SEASONAL   History of colonic polyps    HTN (hypertension)    Osteopenia 06/2019   T score -1.6 FRAX 4.5% / 0.6%.  Stable from prior DEXA    Past Surgical History:  Procedure Laterality Date   ABDOMINAL HYSTERECTOMY  1991   AGUS   BREAST BIOPSY  1998   x2 on right    CHOLECYSTECTOMY  04/28/2004   LUMBAR LAMINECTOMY/DECOMPRESSION MICRODISCECTOMY  07/25/2012   Procedure: LUMBAR LAMINECTOMY/DECOMPRESSION MICRODISCECTOMY  2 LEVELS;  Surgeon: Johnn Hai, MD;  Location: [facility];  Service: Orthopedics;  Laterality: Left;  Lumbar decompression L3-L4, L4 - L5 on the Left (X-Ray)   NECK SURGERY  07/20/2003   for pinched nerve   PARTIAL HYSTERECTOMY  1991    Social History   Socioeconomic History   Marital status: Married    Spouse name: Not on file   Number of children: 2   Years of education: Not on file   Highest education level: High school graduate  Occupational History   Not on file  Tobacco Use   Smoking status: Never   Smokeless tobacco: Never  Vaping Use   Vaping Use: Never used  Substance and Sexual Activity   Alcohol use: No    Alcohol/week: 0.0 standard drinks   Drug use: No   Sexual activity: Not Currently    Birth control/protection: Surgical    Comment: DECLINED INSURANCE QUESTIONS  Other Topics Concern   Not on file  Social History Narrative   Lives at home with her husband   Right handed   Caffeine: 1 soda per week   Social Determinants of Health   Financial Resource Strain: Not on file  Food Insecurity: Not on file  Transportation Needs: Not on file  Physical Activity: Not on file  Stress: Not on  file  Social Connections: Not on file     FAMILY HISTORY:  We obtained a detailed, 4-generation family history.  Significant diagnoses are listed below: Family History  Problem Relation Age of Onset   Hypertension Mother    Breast cancer Mother 48   Arthritis Mother    Colon cancer Mother 74   Heart attack Father 68   Heart disease Father    Breast cancer Sister 75   Hypertension Sister    Arthritis Sister    Neuropathy Sister    Other Sister        H. Pylori   Hypertension Sister    Arthritis Sister    Heart murmur Sister    Hypertension Sister    Arthritis Sister    Heart murmur Sister    Colon cancer Sister 59   Hypertension Sister    Arthritis Sister    Breast cancer Maternal Aunt 44   Colon cancer Maternal Aunt 68   Heart disease Paternal Uncle     Throat cancer Maternal Grandmother    Bladder Cancer Maternal Grandfather    Heart disease Paternal Grandmother    Breast cancer Cousin        mat first cousin   Colon cancer Cousin        >50   Stomach cancer Cousin        >50     The patient has a son and daughter who are cancer free. She has four sisters, one had breast caner at 52 and another had colon cancer at 61.  Both parents are deceased.  The patient's father died of a heart attack and 47.  He had 5 brothers and 5 sisters.  None reported to have cancer. The grandparents are deceased again from non-cancer issues.  The patient's mother had breast cancer at 96 and colon cancer at 95. She had three brothers and four sisters.  One sister had breast cancer over 47 and another sister had colon cancer over 12's.  One brother had a daughter with breast cancer, a sister with colon cancer had a daughter with colon cancer and one with stomach cancer, and the sister with breat cancer had two granddaughters with breast cancer.  The maternal grandparents both had cancer.  The grandmother had throat cancer and the grandfather had bladder cancer.  Katherine Fisher is unaware of previous family history of genetic testing for hereditary cancer risks. Patient's maternal ancestors are of African American descent, and paternal ancestors are of African American descent. There is no reported Ashkenazi Jewish ancestry. There is no known consanguinity.  GENETIC COUNSELING ASSESSMENT: Ms. Rumer is a 70 y.o. female with a family history of cancer which is somewhat suggestive of a hereditary cancer syndrome and predisposition to cancer given the combination of cancers and young ages of onset. We, therefore, discussed and recommended the following at today's visit.   DISCUSSION: We discussed that, in general, most cancer is not inherited in families, but instead is sporadic or familial. Sporadic cancers occur by chance and typically happen at older ages (>50 years) as  this type of cancer is caused by genetic changes acquired during an individual's lifetime. Some families have more cancers than would be expected by chance; however, the ages or types of cancer are not consistent with a known genetic mutation or known genetic mutations have been ruled out. This type of familial cancer is thought to be due to a combination of multiple genetic, environmental, hormonal, and lifestyle factors. While this combination  of factors likely increases the risk of cancer, the exact source of this risk is not currently identifiable or testable.  We discussed that 5 - 10% of breast cancer is hereditary, with most cases associated with BRCA mutations.  About 5-7% of colon cancer is hereditary with most cases associated with Lynch syndrome  There are other genes that can be associated with hereditary breast cancer syndromes.  These include ATM, CHEK2 and PALB2.  We discussed that testing is beneficial for several reasons including knowing how to follow individuals and understand if other family members could be at risk for cancer and allow them to undergo genetic testing.   We reviewed the characteristics, features and inheritance patterns of hereditary cancer syndromes. We also discussed genetic testing, including the appropriate family members to test, the process of testing, insurance coverage and turn-around-time for results. We discussed the implications of a negative, positive, carrier and/or variant of uncertain significant result. We recommended Ms. Faircloth pursue genetic testing for the CancerNext-Expanded+RNAinsight gene panel.   Based on Ms. Deam's family history of cancer, she meets medical criteria for genetic testing. Despite that she meets criteria, she may still have an out of pocket cost. We discussed that if her out of pocket cost for testing is over $100, the laboratory will call and confirm whether she wants to proceed with testing.  If the out of pocket cost of testing is  less than $100 she will be billed by the genetic testing laboratory.   PLAN: After considering the risks, benefits, and limitations, Ms. Pottinger provided informed consent to pursue genetic testing and the blood sample was sent to [facility] for analysis of the CancerNext-Expanded+RNAinsight. Results should be available within approximately 2-3 weeks' time, at which point they will be disclosed by telephone to Ms. Twaddle, as will any additional recommendations warranted by these results. Ms. Harms will receive a summary of her genetic counseling visit and a copy of her results once available. This information will also be available in Epic.   Based on Ms. Leiber's family history, we recommended her sister, who was diagnosed with colon cancer at age 100 or the sister with breast cancer at 61 should have genetic counseling and testing. Ms. Schmoll will let us know if we can be of any assistance in coordinating genetic counseling and/or testing for this family member.   Lastly, we encouraged Ms. Cavey to remain in contact with cancer genetics annually so that we can continuously update the family history and inform her of any changes in cancer genetics and testing that may be of benefit for this family.   Ms. Cardamone questions were answered to her satisfaction today. Our contact information was provided should additional questions or concerns arise. Thank you for the referral and allowing [place] to share in the care of your patient.   Shanay Woolman P. Florene Glen, [place], [facility] Licensed, Insurance risk surveyor [email or web address] phone: [phone]  The patient was seen for a total of 40 minutes in face-to-face genetic counseling.  The patient was seen alone.  This patient was discussed with Drs. Magrinat, Lindi Adie and/or Burr Medico who agrees with the above.    _______________________________________________________________________ For Office Staff:  Number of people involved in session: 1 Was an Intern/  student involved with case: no

## 2021-06-08 NOTE — Progress Notes (Deleted)
REFERRING PROVIDER: Nunzio Cobbs, MD [address] [place],  [place] [address]  PRIMARY PROVIDER:  Kathyrn Lass, MD  PRIMARY REASON FOR VISIT:  No diagnosis found.   HISTORY OF PRESENT ILLNESS:   Katherine Fisher, a 70 y.o. female, was seen for a [place] cancer genetics consultation at the request of Dr. Yisroel Ramming due to a family history of breast and colon cancer.  Ms. Olenick presents to clinic today to discuss the possibility of a hereditary predisposition to cancer, genetic testing, and to further clarify her future cancer risks, as well as potential cancer risks for family members.   Ms. Samad is a 70 y.o. female with no personal history of cancer.    CANCER HISTORY:  Oncology History   No history exists.     RISK FACTORS:  Menarche was at age 49.  First live birth at age 67.  OCP use for approximately 0 years.  Ovaries intact: one.  Hysterectomy: yes.  Menopausal status: postmenopausal.  HRT use: 0 years. Colonoscopy: yes;  <5 polyps . Mammogram within the last year: yes. Number of breast biopsies: 1. Up to date with pelvic exams: yes. Any excessive radiation exposure in the past: no  Past Medical History:  Diagnosis Date   Anemia    Arthritis    lower anemia    Family history of adverse reaction to anesthesia    SISTER CAN'T TOLERATE MEDS   Family history of bladder cancer    Family history of breast cancer    Family history of colon cancer    GERD (gastroesophageal reflux disease)    hx of    Hay fever    SEASONAL   History of colonic polyps    HTN (hypertension)    Osteopenia 06/2019   T score -1.6 FRAX 4.5% / 0.6%.  Stable from prior DEXA    Past Surgical History:  Procedure Laterality Date   ABDOMINAL HYSTERECTOMY  1991   AGUS   BREAST BIOPSY  1998   x2 on right    CHOLECYSTECTOMY  04/28/2004   LUMBAR LAMINECTOMY/DECOMPRESSION MICRODISCECTOMY  07/25/2012   Procedure: LUMBAR LAMINECTOMY/DECOMPRESSION  MICRODISCECTOMY 2 LEVELS;  Surgeon: Johnn Hai, MD;  Location: [facility];  Service: Orthopedics;  Laterality: Left;  Lumbar decompression L3-L4, L4 - L5 on the Left (X-Ray)   NECK SURGERY  07/20/2003   for pinched nerve   PARTIAL HYSTERECTOMY  1991    Social History   Socioeconomic History   Marital status: Married    Spouse name: Not on file   Number of children: 2   Years of education: Not on file   Highest education level: High school graduate  Occupational History   Not on file  Tobacco Use   Smoking status: Never   Smokeless tobacco: Never  Vaping Use   Vaping Use: Never used  Substance and Sexual Activity   Alcohol use: No    Alcohol/week: 0.0 standard drinks   Drug use: No   Sexual activity: Not Currently    Birth control/protection: Surgical    Comment: DECLINED INSURANCE QUESTIONS  Other Topics Concern   Not on file  Social History Narrative   Lives at home with her husband   Right handed   Caffeine: 1 soda per week   Social Determinants of Health   Financial Resource Strain: Not on file  Food Insecurity: Not on file  Transportation Needs: Not on file  Physical Activity: Not on file  Stress:  Not on file  Social Connections: Not on file     FAMILY HISTORY:  We obtained a detailed, 4-generation family history.  Significant diagnoses are listed below: Family History  Problem Relation Age of Onset   Hypertension Mother    Breast cancer Mother 32   Arthritis Mother    Colon cancer Mother 58   Heart attack Father 63   Heart disease Father    Breast cancer Sister 83   Hypertension Sister    Arthritis Sister    Neuropathy Sister    Other Sister        H. Pylori   Hypertension Sister    Arthritis Sister    Heart murmur Sister    Hypertension Sister    Arthritis Sister    Heart murmur Sister    Colon cancer Sister 67   Hypertension Sister    Arthritis Sister    Breast cancer Maternal Aunt 43   Colon cancer Maternal Aunt 68   Heart disease  Paternal Uncle    Throat cancer Maternal Grandmother    Bladder Cancer Maternal Grandfather    Heart disease Paternal Grandmother    Breast cancer Cousin        mat first cousin   Colon cancer Cousin        >50   Stomach cancer Cousin        >50     The patient has a son and daughter who are cancer free. She has four sisters, one had breast caner at 35 and another had colon cancer at 49.  Both parents are deceased.  The patient's father died of a heart attack and 23.  He had 5 brothers and 5 sisters.  None reported to have cancer. The grandparents are deceased again from non-cancer issues.  The patient's mother had breast cancer at 93 and colon cancer at 95. She had three brothers and four sisters.  One sister had breast cancer over 10 and another sister had colon cancer over 4's.  One brother had a daughter with breast cancer, a sister with colon cancer had a daughter with colon cancer and one with stomach cancer, and the sister with breat cancer had two granddaughters with breast cancer.  The maternal grandparents both had cancer.  The grandmother had throat cancer and the grandfather had bladder cancer.  Ms. Heydt is unaware of previous family history of genetic testing for hereditary cancer risks. Patient's maternal ancestors are of African American descent, and paternal ancestors are of African American descent. There is no reported Ashkenazi Jewish ancestry. There is no known consanguinity.  GENETIC COUNSELING ASSESSMENT: Ms. Gracey is a 70 y.o. female with a family history of cancer which is somewhat suggestive of a hereditary cancer syndrome and predisposition to cancer given the combination of cancers and young ages of onset. We, therefore, discussed and recommended the following at today's visit.   DISCUSSION: We discussed that, in general, most cancer is not inherited in families, but instead is sporadic or familial. Sporadic cancers occur by chance and typically happen at older  ages (>50 years) as this type of cancer is caused by genetic changes acquired during an individual's lifetime. Some families have more cancers than would be expected by chance; however, the ages or types of cancer are not consistent with a known genetic mutation or known genetic mutations have been ruled out. This type of familial cancer is thought to be due to a combination of multiple genetic, environmental, hormonal, and lifestyle factors. While  this combination of factors likely increases the risk of cancer, the exact source of this risk is not currently identifiable or testable.  We discussed that 5 - 10% of breast cancer is hereditary, with most cases associated with BRCA mutations.  About 5-7% of colon cancer is hereditary with most cases associated with Lynch syndrome  There are other genes that can be associated with hereditary breast cancer syndromes.  These include ATM, CHEK2 and PALB2.  We discussed that testing is beneficial for several reasons including knowing how to follow individuals and understand if other family members could be at risk for cancer and allow them to undergo genetic testing.   We reviewed the characteristics, features and inheritance patterns of hereditary cancer syndromes. We also discussed genetic testing, including the appropriate family members to test, the process of testing, insurance coverage and turn-around-time for results. We discussed the implications of a negative, positive, carrier and/or variant of uncertain significant result. We recommended Ms. Toelle pursue genetic testing for the CancerNext-Expanded+RNAinsight gene panel.   Based on Ms. Becker's family history of cancer, she meets medical criteria for genetic testing. Despite that she meets criteria, she may still have an out of pocket cost. We discussed that if her out of pocket cost for testing is over $100, the laboratory will call and confirm whether she wants to proceed with testing.  If the out of pocket  cost of testing is less than $100 she will be billed by the genetic testing laboratory.   PLAN: After considering the risks, benefits, and limitations, Ms. Fogarty provided informed consent to pursue genetic testing and the blood sample was sent to [facility] for analysis of the CancerNext-Expanded+RNAinsight. Results should be available within approximately 2-3 weeks' time, at which point they will be disclosed by telephone to Ms. Malicoat, as will any additional recommendations warranted by these results. Ms. Marcou will receive a summary of her genetic counseling visit and a copy of her results once available. This information will also be available in Epic.   Based on Ms. Carden's family history, we recommended her sister, who was diagnosed with colon cancer at age 70 or the sister with breast cancer at 94 should have genetic counseling and testing. Ms. Devlin will let us know if we can be of any assistance in coordinating genetic counseling and/or testing for this family member.   Lastly, we encouraged Ms. Gupton to remain in contact with cancer genetics annually so that we can continuously update the family history and inform her of any changes in cancer genetics and testing that may be of benefit for this family.   Ms. Gruner questions were answered to her satisfaction today. Our contact information was provided should additional questions or concerns arise. Thank you for the referral and allowing [place] to share in the care of your patient.   Teng Decou P. Florene Glen, [place], [facility] Licensed, Insurance risk surveyor [email or web address] phone: [phone]  The patient was seen for a total of 40 minutes in face-to-face genetic counseling.  The patient was seen alone.  This patient was discussed with Drs. Magrinat, Lindi Adie and/or Burr Medico who agrees with the above.    _______________________________________________________________________ For Office Staff:  Number of people involved in  session: 1 Was an Intern/ student involved with case: no

## 2021-06-16 ENCOUNTER — Ambulatory Visit (INDEPENDENT_AMBULATORY_CARE_PROVIDER_SITE_OTHER): Payer: MEDICARE

## 2021-06-16 ENCOUNTER — Other Ambulatory Visit: Payer: Self-pay

## 2021-06-16 ENCOUNTER — Other Ambulatory Visit: Payer: Self-pay | Admitting: Obstetrics and Gynecology

## 2021-06-16 DIAGNOSIS — M8580 Other specified disorders of bone density and structure, unspecified site: Secondary | ICD-10-CM

## 2021-06-16 DIAGNOSIS — M8589 Other specified disorders of bone density and structure, multiple sites: Secondary | ICD-10-CM

## 2021-06-16 DIAGNOSIS — Z780 Asymptomatic menopausal state: Secondary | ICD-10-CM

## 2021-06-23 ENCOUNTER — Telehealth: Payer: Self-pay | Admitting: Genetic Counselor

## 2021-06-23 ENCOUNTER — Encounter: Payer: Self-pay | Admitting: Genetic Counselor

## 2021-06-23 ENCOUNTER — Ambulatory Visit: Payer: Self-pay | Admitting: Genetic Counselor

## 2021-06-23 DIAGNOSIS — Z1379 Encounter for other screening for genetic and chromosomal anomalies: Secondary | ICD-10-CM | POA: Insufficient documentation

## 2021-06-23 NOTE — Telephone Encounter (Signed)
Revealed negative genetic testing.  Discussed that we do not know why there is cancer in the family. It could be due to a different gene that we are not testing, or maybe our current technology may not be able to pick something up.  It will be important for her to keep in contact with genetics to keep up with whether additional testing may be needed.  

## 2021-06-23 NOTE — Progress Notes (Signed)
HPI:  Katherine Fisher was previously seen in the [place] [facility] due to a family history of breast and colon and concerns regarding a hereditary predisposition to cancer. Please refer to our prior cancer genetics clinic note for more information regarding our discussion, assessment and recommendations, at the time. Katherine Fisher recent genetic test results were disclosed to her, as were recommendations warranted by these results. These results and recommendations are discussed in more detail below.  CANCER HISTORY:  Oncology History   No history exists.    FAMILY HISTORY:  We obtained a detailed, 4-generation family history.  Significant diagnoses are listed below: Family History  Problem Relation Age of Onset   Hypertension Mother    Breast cancer Mother 34   Arthritis Mother    Colon cancer Mother 45   Heart attack Father 55   Heart disease Father    Breast cancer Sister 20   Hypertension Sister    Arthritis Sister    Neuropathy Sister    Other Sister        H. Pylori   Hypertension Sister    Arthritis Sister    Heart murmur Sister    Hypertension Sister    Arthritis Sister    Heart murmur Sister    Colon cancer Sister 43   Hypertension Sister    Arthritis Sister    Breast cancer Maternal Aunt 35   Colon cancer Maternal Aunt 68   Heart disease Paternal Uncle    Throat cancer Maternal Grandmother    Bladder Cancer Maternal Grandfather    Heart disease Paternal Grandmother    Breast cancer Cousin        mat first cousin   Colon cancer Cousin        >50   Stomach cancer Cousin        >50      The patient has a son and daughter who are cancer free. She has four sisters, one had breast caner at 57 and another had colon cancer at 5.  Both parents are deceased.   The patient's father died of a heart attack and 104.  He had 5 brothers and 5 sisters.  None reported to have cancer. The grandparents are deceased again from non-cancer issues.   The patient's mother  had breast cancer at 60 and colon cancer at 95. She had three brothers and four sisters.  One sister had breast cancer over 31 and another sister had colon cancer over 73's.  One brother had a daughter with breast cancer, a sister with colon cancer had a daughter with colon cancer and one with stomach cancer, and the sister with breat cancer had two granddaughters with breast cancer.  The maternal grandparents both had cancer.  The grandmother had throat cancer and the grandfather had bladder cancer.   Katherine Fisher is unaware of previous family history of genetic testing for hereditary cancer risks. Patient's maternal ancestors are of African American descent, and paternal ancestors are of African American descent. There is no reported Ashkenazi Jewish ancestry. There is no known consanguinity.  GENETIC TEST RESULTS: Genetic testing reported out on June 21, 2021 through the CancerNext-Expanded+RNAinsight cancer panel found no pathogenic mutations. The CancerNext-Expanded gene panel offered by [facility] and includes sequencing and rearrangement analysis for the following 77 genes: AIP, ALK, APC*, ATM*, AXIN2, BAP1, BARD1, BLM, BMPR1A, BRCA1*, BRCA2*, BRIP1*, CDC73, CDH1*, CDK4, CDKN1B, CDKN2A, CHEK2*, CTNNA1, DICER1, FANCC, FH, FLCN, GALNT12, KIF1B, LZTR1, MAX, MEN1, MET, MLH1*, MSH2*, MSH3,  MSH6*, MUTYH*, NBN, NF1*, NF2, NTHL1, PALB2*, PHOX2B, PMS2*, POT1, PRKAR1A, PTCH1, PTEN*, RAD51C*, RAD51D*, RB1, RECQL, RET, SDHA, SDHAF2, SDHB, SDHC, SDHD, SMAD4, SMARCA4, SMARCB1, SMARCE1, STK11, SUFU, TMEM127, TP53*, TSC1, TSC2, VHL and XRCC2 (sequencing and deletion/duplication); EGFR, EGLN1, HOXB13, KIT, MITF, PDGFRA, POLD1, and POLE (sequencing only); EPCAM and GREM1 (deletion/duplication only). DNA and RNA analyses performed for * genes. The test report has been scanned into EPIC and is located under the Molecular Pathology section of the Results Review tab.  A portion of the result report is included below for  reference.     We discussed with Katherine Fisher that because current genetic testing is not perfect, it is possible there may be a gene mutation in one of these genes that current testing cannot detect, but that chance is small.  We also discussed, that there could be another gene that has not yet been discovered, or that we have not yet tested, that is responsible for the cancer diagnoses in the family. It is also possible there is a hereditary cause for the cancer in the family that Katherine Fisher did not inherit and therefore was not identified in her testing.  Therefore, it is important to remain in touch with cancer genetics in the future so that we can continue to offer Katherine Fisher the most up to date genetic testing.   ADDITIONAL GENETIC TESTING: We discussed with Katherine Fisher that her genetic testing was fairly extensive.  If there are genes identified to increase cancer risk that can be analyzed in the future, we would be happy to discuss and coordinate this testing at that time.    CANCER SCREENING RECOMMENDATIONS: Katherine Fisher test result is considered negative (normal).  This means that we have not identified a hereditary cause for her family history of breast and colon cancer at this time. Most cancers happen by chance and this negative test suggests that her cancer may fall into this category.    While reassuring, this does not definitively rule out a hereditary predisposition to cancer. It is still possible that there could be genetic mutations that are undetectable by current technology. There could be genetic mutations in genes that have not been tested or identified to increase cancer risk.  Therefore, it is recommended she continue to follow the cancer management and screening guidelines provided by her oncology and primary healthcare provider.   An individual's cancer risk and medical management are not determined by genetic test results alone. Overall cancer risk assessment incorporates additional  factors, including personal medical history, family history, and any available genetic information that may result in a personalized plan for cancer prevention and surveillance  RECOMMENDATIONS FOR FAMILY MEMBERS:  Individuals in this family might be at some increased risk of developing cancer, over the general population risk, simply due to the family history of cancer.  We recommended women in this family have a yearly mammogram beginning at age 16, or 22 years younger than the earliest onset of cancer, an annual clinical breast exam, and perform monthly breast self-exams. Women in this family should also have a gynecological exam as recommended by their primary provider. All family members should be referred for colonoscopy starting at age 31.  It is also possible there is a hereditary cause for the cancer in Katherine Fisher family that she did not inherit and therefore was not identified in her.  Based on Katherine Fisher family history, we recommended her sister, Lorna Dibble, who was diagnosed with colon cancer at age 8, have  genetic counseling and testing. Katherine Fisher will let us know if we can be of any assistance in coordinating genetic counseling and/or testing for this family member.   FOLLOW-UP: Lastly, we discussed with Katherine Fisher that cancer genetics is a rapidly advancing field and it is possible that new genetic tests will be appropriate for her and/or her family members in the future. We encouraged her to remain in contact with cancer genetics on an annual basis so we can update her personal and family histories and let her know of advances in cancer genetics that may benefit this family.   Our contact number was provided. Katherine Fisher questions were answered to her satisfaction, and she knows she is welcome to call us at anytime with additional questions or concerns.   Roma Kayser, [place], [facility] Licensed, Certified Genetic Counselor [email or web address]

## 2021-07-09 DIAGNOSIS — Z23 Encounter for immunization: Secondary | ICD-10-CM | POA: Diagnosis not present

## 2021-07-12 DIAGNOSIS — Z20822 Contact with and (suspected) exposure to covid-19: Secondary | ICD-10-CM | POA: Diagnosis not present

## 2021-07-28 DIAGNOSIS — M519 Unspecified thoracic, thoracolumbar and lumbosacral intervertebral disc disorder: Secondary | ICD-10-CM | POA: Diagnosis not present

## 2021-07-28 DIAGNOSIS — M542 Cervicalgia: Secondary | ICD-10-CM | POA: Diagnosis not present

## 2021-08-04 DIAGNOSIS — Z20822 Contact with and (suspected) exposure to covid-19: Secondary | ICD-10-CM | POA: Diagnosis not present

## 2021-08-10 DIAGNOSIS — Z86010 Personal history of colonic polyps: Secondary | ICD-10-CM | POA: Diagnosis not present

## 2021-08-10 DIAGNOSIS — K648 Other hemorrhoids: Secondary | ICD-10-CM | POA: Diagnosis not present

## 2021-08-21 DIAGNOSIS — M542 Cervicalgia: Secondary | ICD-10-CM | POA: Diagnosis not present

## 2021-08-29 DIAGNOSIS — M542 Cervicalgia: Secondary | ICD-10-CM | POA: Diagnosis not present

## 2021-08-29 DIAGNOSIS — M5412 Radiculopathy, cervical region: Secondary | ICD-10-CM | POA: Diagnosis not present

## 2021-08-30 DIAGNOSIS — M47896 Other spondylosis, lumbar region: Secondary | ICD-10-CM | POA: Diagnosis not present

## 2021-08-30 DIAGNOSIS — I10 Essential (primary) hypertension: Secondary | ICD-10-CM | POA: Diagnosis not present

## 2021-08-30 DIAGNOSIS — Z86010 Personal history of colonic polyps: Secondary | ICD-10-CM | POA: Diagnosis not present

## 2021-08-30 DIAGNOSIS — J309 Allergic rhinitis, unspecified: Secondary | ICD-10-CM | POA: Diagnosis not present

## 2021-08-30 DIAGNOSIS — Z0000 Encounter for general adult medical examination without abnormal findings: Secondary | ICD-10-CM | POA: Diagnosis not present

## 2021-09-02 DIAGNOSIS — M542 Cervicalgia: Secondary | ICD-10-CM | POA: Diagnosis not present

## 2021-09-13 DIAGNOSIS — M4722 Other spondylosis with radiculopathy, cervical region: Secondary | ICD-10-CM | POA: Diagnosis not present

## 2021-09-13 DIAGNOSIS — Z6824 Body mass index (BMI) 24.0-24.9, adult: Secondary | ICD-10-CM | POA: Diagnosis not present

## 2021-09-13 DIAGNOSIS — I10 Essential (primary) hypertension: Secondary | ICD-10-CM | POA: Diagnosis not present

## 2021-09-13 DIAGNOSIS — D492 Neoplasm of unspecified behavior of bone, soft tissue, and skin: Secondary | ICD-10-CM | POA: Diagnosis not present

## 2021-09-13 DIAGNOSIS — M542 Cervicalgia: Secondary | ICD-10-CM | POA: Diagnosis not present

## 2021-09-16 DIAGNOSIS — R6884 Jaw pain: Secondary | ICD-10-CM | POA: Diagnosis not present

## 2021-09-16 DIAGNOSIS — M26609 Unspecified temporomandibular joint disorder, unspecified side: Secondary | ICD-10-CM | POA: Diagnosis not present

## 2021-09-20 DIAGNOSIS — M542 Cervicalgia: Secondary | ICD-10-CM | POA: Diagnosis not present

## 2021-09-20 DIAGNOSIS — M6281 Muscle weakness (generalized): Secondary | ICD-10-CM | POA: Diagnosis not present

## 2021-09-20 DIAGNOSIS — M5382 Other specified dorsopathies, cervical region: Secondary | ICD-10-CM | POA: Diagnosis not present

## 2021-09-22 DIAGNOSIS — M6281 Muscle weakness (generalized): Secondary | ICD-10-CM | POA: Diagnosis not present

## 2021-09-22 DIAGNOSIS — M1990 Unspecified osteoarthritis, unspecified site: Secondary | ICD-10-CM | POA: Diagnosis not present

## 2021-09-22 DIAGNOSIS — I10 Essential (primary) hypertension: Secondary | ICD-10-CM | POA: Diagnosis not present

## 2021-09-22 DIAGNOSIS — M542 Cervicalgia: Secondary | ICD-10-CM | POA: Diagnosis not present

## 2021-09-22 DIAGNOSIS — M5382 Other specified dorsopathies, cervical region: Secondary | ICD-10-CM | POA: Diagnosis not present

## 2021-09-22 DIAGNOSIS — M8580 Other specified disorders of bone density and structure, unspecified site: Secondary | ICD-10-CM | POA: Diagnosis not present

## 2021-09-26 DIAGNOSIS — M542 Cervicalgia: Secondary | ICD-10-CM | POA: Diagnosis not present

## 2021-09-26 DIAGNOSIS — M6281 Muscle weakness (generalized): Secondary | ICD-10-CM | POA: Diagnosis not present

## 2021-09-26 DIAGNOSIS — M1990 Unspecified osteoarthritis, unspecified site: Secondary | ICD-10-CM | POA: Diagnosis not present

## 2021-09-26 DIAGNOSIS — M5382 Other specified dorsopathies, cervical region: Secondary | ICD-10-CM | POA: Diagnosis not present

## 2021-09-26 DIAGNOSIS — M8580 Other specified disorders of bone density and structure, unspecified site: Secondary | ICD-10-CM | POA: Diagnosis not present

## 2021-09-26 DIAGNOSIS — I10 Essential (primary) hypertension: Secondary | ICD-10-CM | POA: Diagnosis not present

## 2021-09-28 DIAGNOSIS — M8580 Other specified disorders of bone density and structure, unspecified site: Secondary | ICD-10-CM | POA: Diagnosis not present

## 2021-09-28 DIAGNOSIS — M5382 Other specified dorsopathies, cervical region: Secondary | ICD-10-CM | POA: Diagnosis not present

## 2021-09-28 DIAGNOSIS — M542 Cervicalgia: Secondary | ICD-10-CM | POA: Diagnosis not present

## 2021-09-28 DIAGNOSIS — M1990 Unspecified osteoarthritis, unspecified site: Secondary | ICD-10-CM | POA: Diagnosis not present

## 2021-09-28 DIAGNOSIS — I10 Essential (primary) hypertension: Secondary | ICD-10-CM | POA: Diagnosis not present

## 2021-09-28 DIAGNOSIS — M6281 Muscle weakness (generalized): Secondary | ICD-10-CM | POA: Diagnosis not present

## 2021-10-03 DIAGNOSIS — M6281 Muscle weakness (generalized): Secondary | ICD-10-CM | POA: Diagnosis not present

## 2021-10-03 DIAGNOSIS — M542 Cervicalgia: Secondary | ICD-10-CM | POA: Diagnosis not present

## 2021-10-03 DIAGNOSIS — M5382 Other specified dorsopathies, cervical region: Secondary | ICD-10-CM | POA: Diagnosis not present

## 2021-10-03 DIAGNOSIS — M1990 Unspecified osteoarthritis, unspecified site: Secondary | ICD-10-CM | POA: Diagnosis not present

## 2021-10-03 DIAGNOSIS — M8580 Other specified disorders of bone density and structure, unspecified site: Secondary | ICD-10-CM | POA: Diagnosis not present

## 2021-10-03 DIAGNOSIS — I10 Essential (primary) hypertension: Secondary | ICD-10-CM | POA: Diagnosis not present

## 2021-10-05 DIAGNOSIS — I10 Essential (primary) hypertension: Secondary | ICD-10-CM | POA: Diagnosis not present

## 2021-10-05 DIAGNOSIS — E7800 Pure hypercholesterolemia, unspecified: Secondary | ICD-10-CM | POA: Diagnosis not present

## 2021-12-02 DIAGNOSIS — Z79899 Other long term (current) drug therapy: Secondary | ICD-10-CM | POA: Diagnosis not present

## 2021-12-02 DIAGNOSIS — E7800 Pure hypercholesterolemia, unspecified: Secondary | ICD-10-CM | POA: Diagnosis not present

## 2021-12-13 DIAGNOSIS — M2578 Osteophyte, vertebrae: Secondary | ICD-10-CM | POA: Diagnosis not present

## 2021-12-13 DIAGNOSIS — D492 Neoplasm of unspecified behavior of bone, soft tissue, and skin: Secondary | ICD-10-CM | POA: Diagnosis not present

## 2021-12-13 DIAGNOSIS — M542 Cervicalgia: Secondary | ICD-10-CM | POA: Diagnosis not present

## 2022-02-21 DIAGNOSIS — D492 Neoplasm of unspecified behavior of bone, soft tissue, and skin: Secondary | ICD-10-CM | POA: Diagnosis not present

## 2022-03-01 DIAGNOSIS — H25813 Combined forms of age-related cataract, bilateral: Secondary | ICD-10-CM | POA: Diagnosis not present

## 2022-03-01 DIAGNOSIS — H52203 Unspecified astigmatism, bilateral: Secondary | ICD-10-CM | POA: Diagnosis not present

## 2022-03-01 DIAGNOSIS — H5203 Hypermetropia, bilateral: Secondary | ICD-10-CM | POA: Diagnosis not present

## 2022-03-10 ENCOUNTER — Other Ambulatory Visit: Payer: Self-pay | Admitting: Family Medicine

## 2022-03-10 DIAGNOSIS — Z1231 Encounter for screening mammogram for malignant neoplasm of breast: Secondary | ICD-10-CM

## 2022-05-29 ENCOUNTER — Ambulatory Visit
Admission: RE | Admit: 2022-05-29 | Discharge: 2022-05-29 | Disposition: A | Discharge status: Home / Self Care | Payer: MEDICARE | Source: Ambulatory Visit | Attending: Family Medicine | Admitting: Family Medicine

## 2022-05-29 DIAGNOSIS — Z1231 Encounter for screening mammogram for malignant neoplasm of breast: Secondary | ICD-10-CM | POA: Diagnosis not present

## 2022-06-06 NOTE — Progress Notes (Unsigned)
71 y.o. G2P2 Married [place] American female here for annual breast and pelvic exam.    Not using vaginal estrogen cream.  No vaginal itching or irritation.   She is also followed for osteopenia of the bilateral hips.   Her FRAX model.    PCP:   Kathyrn Lass, MD  No LMP recorded. Patient has had a hysterectomy.           Sexually active: No. Husband w/prostate surgery The current method of family planning is status post hysterectomy.    Exercising: Yes.     Stationary bike, walking Smoker:  no  Health Maintenance: Pap:  06-02-21 Neg:Neg HR HPV, 05-27-19 Neg, 05-19-16 Neg, 05-19-15 Neg History of abnormal Pap:  no MMG:  05-29-22 Neg/BiRads1 Colonoscopy:  02/2022;7 years BMD:  06-16-21  Result :osteopenia of the hips.  FRAX - 5% risk of major fracture and 0.9% risk of hip fracture. TDaP:  PCP Gardasil:   no HIV: Neg in past Hep C:Neg in past Screening Labs: PCP   reports that she has never smoked. She has never used smokeless tobacco. She reports that she does not drink alcohol and does not use drugs.  Past Medical History:  Diagnosis Date   Anemia    Arthritis    lower anemia    Family history of adverse reaction to anesthesia    SISTER CAN'T TOLERATE MEDS   Family history of bladder cancer    Family history of breast cancer    Family history of colon cancer    GERD (gastroesophageal reflux disease)    hx of    Hay fever    SEASONAL   History of colonic polyps    HTN (hypertension)    Osteopenia 06/2019   T score -1.6 FRAX 4.5% / 0.6%.  Stable from prior DEXA    Past Surgical History:  Procedure Laterality Date   ABDOMINAL HYSTERECTOMY  1991   AGUS   BREAST BIOPSY  1998   x2 on right    CHOLECYSTECTOMY  04/28/2004   LUMBAR LAMINECTOMY/DECOMPRESSION MICRODISCECTOMY  07/25/2012   Procedure: LUMBAR LAMINECTOMY/DECOMPRESSION MICRODISCECTOMY 2 LEVELS;  Surgeon: Johnn Hai, MD;  Location: [facility];  Service: Orthopedics;  Laterality: Left;  Lumbar decompression L3-L4, L4  - L5 on the Left (X-Ray)   NECK SURGERY  07/20/2003   for pinched nerve   PARTIAL HYSTERECTOMY  1991    Current Outpatient Medications  Medication Sig Dispense Refill   fluticasone (FLONASE) 50 MCG/ACT nasal spray Place 2 sprays into the nose daily as needed for allergies. allergies     hydrochlorothiazide (HYDRODIURIL) 12.5 MG tablet Take 12.5 mg by mouth every morning.     levocetirizine (XYZAL) 5 MG tablet Take 5 mg by mouth daily as needed for allergies. As needed     lisinopril (PRINIVIL,ZESTRIL) 40 MG tablet Take 40 mg by mouth daily.     montelukast (SINGULAIR) 10 MG tablet      Multiple Vitamin (MULTIVITAMIN) tablet Take 1 tablet by mouth daily.     Omega-3 Fatty Acids (FISH OIL) 1000 MG CAPS Take 1,000 mg by mouth daily.     Rosuvastatin Calcium 5 MG CPSP      traMADol (ULTRAM) 50 MG tablet TAKE 1 TABLET BY MOUTH EVERY DAY AT BEDTIME FOR 30 DAYS     vitamin B-12 (CYANOCOBALAMIN) 1000 MCG tablet Take 1,000 mcg by mouth daily.     VITAMIN D PO Take 2,000 Int'l Units by mouth daily.     No current facility-administered  medications for this visit.    Family History  Problem Relation Age of Onset   Hypertension Mother    Breast cancer Mother 66   Arthritis Mother    Colon cancer Mother 58   Heart attack Father 58   Heart disease Father    Breast cancer Sister 65   Hypertension Sister    Arthritis Sister    Neuropathy Sister    Other Sister        H. Pylori   Hypertension Sister    Arthritis Sister    Heart murmur Sister    Hypertension Sister    Arthritis Sister    Heart murmur Sister    Colon cancer Sister 73   Hypertension Sister    Arthritis Sister    Breast cancer Maternal Aunt 70   Colon cancer Maternal Aunt 68   Heart disease Paternal Uncle    Throat cancer Maternal Grandmother    Bladder Cancer Maternal Grandfather    Heart disease Paternal Grandmother    Breast cancer Cousin        mat first cousin   Colon cancer Cousin        >50   Stomach cancer  Cousin        >50    Review of Systems  All other systems reviewed and are negative.   Exam:   BP 122/76   Pulse 69   Ht $R'5\' 4"'xV$  (1.626 m)   Wt 146 lb (66.2 kg)   SpO2 97%   BMI 25.06 kg/m     General appearance: alert, cooperative and appears stated age Head: normocephalic, without obvious abnormality, atraumatic Neck: no adenopathy, supple, symmetrical, trachea midline and thyroid normal to inspection and palpation Lungs: clear to auscultation bilaterally Breasts: normal appearance, no masses or tenderness, No nipple retraction or dimpling, No nipple discharge or bleeding, No axillary adenopathy Heart: regular rate and rhythm Abdomen: soft, non-tender; no masses, no organomegaly Extremities: extremities normal, atraumatic, no cyanosis or edema Skin: skin color, texture, turgor normal. No rashes or lesions Lymph nodes: cervical, supraclavicular, and axillary nodes normal. Neurologic: grossly normal  Pelvic: External genitalia:  no lesions              No abnormal inguinal nodes palpated.              Urethra:  normal appearing urethra with no masses, tenderness or lesions              Bartholins and Skenes: normal                 Vagina: atrophy noted.              Cervix: absent              Pap taken: no Bimanual Exam:  Uterus:  absent              Adnexa: no mass, fullness, tenderness              Rectal exam: yes.  Confirms.              Anus:  normal sphincter tone, no lesions  Chaperone was present for exam:  Estill Bamberg, CMA  Assessment:   Well woman visit with gynecologic exam. Status post TAH/LSO for atypical glandular cells, endometriosis.  Dr. Ubaldo Glassing, 1991. Paps normal following hysterectomy. One ovary remains.  Vaginal atrophy.  Not using Premarin cream.   Osteopenia.  FH breast cancer in mother, maternal aunt and cousins.  Negative  genetic testing for BRCA1 and 2.   Plan: Mammogram screening discussed. Self breast awareness reviewed. Pap and HR HPV not  indicated.  Guidelines for Calcium, Vitamin D, regular exercise program including cardiovascular and weight bearing exercise. We discussed risks and benefits of vaginal estrogen treatment.  Patient declines this.  We reviewed her BMD in 2022 and her FRAX numbers. BMD next year.  Follow up annually and prn.   After visit summary provided.   29 min  total time was spent for this patient encounter, including preparation, face-to-face counseling with the patient, coordination of care, and documentation of the encounter.

## 2022-06-08 ENCOUNTER — Encounter: Payer: Self-pay | Admitting: Obstetrics and Gynecology

## 2022-06-08 ENCOUNTER — Ambulatory Visit (INDEPENDENT_AMBULATORY_CARE_PROVIDER_SITE_OTHER): Payer: MEDICARE | Admitting: Obstetrics and Gynecology

## 2022-06-08 VITALS — BP 122/76 | HR 69 | Ht 64.0 in | Wt 146.0 lb

## 2022-06-08 DIAGNOSIS — N952 Postmenopausal atrophic vaginitis: Secondary | ICD-10-CM

## 2022-06-08 DIAGNOSIS — Z803 Family history of malignant neoplasm of breast: Secondary | ICD-10-CM | POA: Diagnosis not present

## 2022-06-08 DIAGNOSIS — M8580 Other specified disorders of bone density and structure, unspecified site: Secondary | ICD-10-CM | POA: Diagnosis not present

## 2022-06-08 DIAGNOSIS — Z01419 Encounter for gynecological examination (general) (routine) without abnormal findings: Secondary | ICD-10-CM

## 2022-06-08 NOTE — Patient Instructions (Signed)

## 2022-06-09 DIAGNOSIS — Z23 Encounter for immunization: Secondary | ICD-10-CM | POA: Diagnosis not present

## 2022-06-28 DIAGNOSIS — M25511 Pain in right shoulder: Secondary | ICD-10-CM | POA: Diagnosis not present

## 2022-07-20 DIAGNOSIS — Z23 Encounter for immunization: Secondary | ICD-10-CM | POA: Diagnosis not present

## 2022-07-30 DIAGNOSIS — M25511 Pain in right shoulder: Secondary | ICD-10-CM | POA: Diagnosis not present

## 2022-08-11 DIAGNOSIS — M25511 Pain in right shoulder: Secondary | ICD-10-CM | POA: Diagnosis not present

## 2022-08-17 DIAGNOSIS — M5030 Other cervical disc degeneration, unspecified cervical region: Secondary | ICD-10-CM | POA: Diagnosis not present

## 2022-08-17 DIAGNOSIS — D492 Neoplasm of unspecified behavior of bone, soft tissue, and skin: Secondary | ICD-10-CM | POA: Diagnosis not present

## 2022-08-22 DIAGNOSIS — D321 Benign neoplasm of spinal meninges: Secondary | ICD-10-CM | POA: Diagnosis not present

## 2022-09-21 DIAGNOSIS — Z1389 Encounter for screening for other disorder: Secondary | ICD-10-CM | POA: Diagnosis not present

## 2022-09-21 DIAGNOSIS — Z0000 Encounter for general adult medical examination without abnormal findings: Secondary | ICD-10-CM | POA: Diagnosis not present

## 2022-09-21 DIAGNOSIS — Z23 Encounter for immunization: Secondary | ICD-10-CM | POA: Diagnosis not present

## 2022-09-21 DIAGNOSIS — Z6824 Body mass index (BMI) 24.0-24.9, adult: Secondary | ICD-10-CM | POA: Diagnosis not present

## 2022-09-27 DIAGNOSIS — Z23 Encounter for immunization: Secondary | ICD-10-CM | POA: Diagnosis not present

## 2022-09-27 DIAGNOSIS — J309 Allergic rhinitis, unspecified: Secondary | ICD-10-CM | POA: Diagnosis not present

## 2022-09-27 DIAGNOSIS — M47896 Other spondylosis, lumbar region: Secondary | ICD-10-CM | POA: Diagnosis not present

## 2022-09-27 DIAGNOSIS — M8589 Other specified disorders of bone density and structure, multiple sites: Secondary | ICD-10-CM | POA: Diagnosis not present

## 2022-09-27 DIAGNOSIS — I10 Essential (primary) hypertension: Secondary | ICD-10-CM | POA: Diagnosis not present

## 2022-09-27 DIAGNOSIS — E7800 Pure hypercholesterolemia, unspecified: Secondary | ICD-10-CM | POA: Diagnosis not present

## 2022-09-27 DIAGNOSIS — Z6824 Body mass index (BMI) 24.0-24.9, adult: Secondary | ICD-10-CM | POA: Diagnosis not present

## 2022-09-27 DIAGNOSIS — D649 Anemia, unspecified: Secondary | ICD-10-CM | POA: Diagnosis not present

## 2022-09-27 DIAGNOSIS — M8580 Other specified disorders of bone density and structure, unspecified site: Secondary | ICD-10-CM | POA: Diagnosis not present

## 2023-03-27 ENCOUNTER — Ambulatory Visit
Admission: RE | Admit: 2023-03-27 | Discharge: 2023-03-27 | Disposition: A | Discharge status: Home / Self Care | Payer: MEDICARE | Source: Ambulatory Visit | Attending: Pain Medicine | Admitting: Pain Medicine

## 2023-03-27 ENCOUNTER — Other Ambulatory Visit: Payer: Self-pay | Admitting: Family Medicine

## 2023-03-27 ENCOUNTER — Other Ambulatory Visit: Payer: Self-pay | Admitting: Pain Medicine

## 2023-03-27 DIAGNOSIS — M25552 Pain in left hip: Secondary | ICD-10-CM

## 2023-03-27 DIAGNOSIS — I10 Essential (primary) hypertension: Secondary | ICD-10-CM | POA: Diagnosis not present

## 2023-03-27 DIAGNOSIS — Z6824 Body mass index (BMI) 24.0-24.9, adult: Secondary | ICD-10-CM | POA: Diagnosis not present

## 2023-03-27 DIAGNOSIS — E785 Hyperlipidemia, unspecified: Secondary | ICD-10-CM | POA: Diagnosis not present

## 2023-03-27 DIAGNOSIS — M47816 Spondylosis without myelopathy or radiculopathy, lumbar region: Secondary | ICD-10-CM | POA: Diagnosis not present

## 2023-03-27 DIAGNOSIS — Z1231 Encounter for screening mammogram for malignant neoplasm of breast: Secondary | ICD-10-CM

## 2023-03-27 DIAGNOSIS — M8580 Other specified disorders of bone density and structure, unspecified site: Secondary | ICD-10-CM | POA: Diagnosis not present

## 2023-03-27 DIAGNOSIS — Z1239 Encounter for other screening for malignant neoplasm of breast: Secondary | ICD-10-CM | POA: Diagnosis not present

## 2023-03-27 DIAGNOSIS — D649 Anemia, unspecified: Secondary | ICD-10-CM | POA: Diagnosis not present

## 2023-03-27 DIAGNOSIS — J309 Allergic rhinitis, unspecified: Secondary | ICD-10-CM | POA: Diagnosis not present

## 2023-05-01 DIAGNOSIS — M5451 Vertebrogenic low back pain: Secondary | ICD-10-CM | POA: Diagnosis not present

## 2023-05-01 DIAGNOSIS — M5136 Other intervertebral disc degeneration, lumbar region: Secondary | ICD-10-CM | POA: Diagnosis not present

## 2023-05-01 DIAGNOSIS — M5459 Other low back pain: Secondary | ICD-10-CM | POA: Diagnosis not present

## 2023-05-15 DIAGNOSIS — M5416 Radiculopathy, lumbar region: Secondary | ICD-10-CM | POA: Diagnosis not present

## 2023-05-22 DIAGNOSIS — M5136 Other intervertebral disc degeneration, lumbar region: Secondary | ICD-10-CM | POA: Diagnosis not present

## 2023-05-22 DIAGNOSIS — Z23 Encounter for immunization: Secondary | ICD-10-CM | POA: Diagnosis not present

## 2023-05-22 DIAGNOSIS — M5451 Vertebrogenic low back pain: Secondary | ICD-10-CM | POA: Diagnosis not present

## 2023-05-31 NOTE — Progress Notes (Deleted)
GYNECOLOGY  VISIT   HPI: 72 y.o.   Married  [place] American  female   G2P2 with No LMP recorded. Patient has had a hysterectomy.   here for   1 yr med check  GYNECOLOGIC HISTORY: No LMP recorded. Patient has had a hysterectomy. Contraception:  hyst Menopausal hormone therapy:  n/a Last mammogram:   05-29-22 Neg/BiRads1  Last pap smear:   06-02-21 Neg:Neg HR HPV, 05-27-19 Neg, 05-19-16 Neg, 05-19-15 Neg         OB History     Gravida  2   Para  2   Term      Preterm      AB      Living  2      SAB      IAB      Ectopic      Multiple      Live Births                 Patient Active Problem List   Diagnosis Date Noted   Genetic testing 06/23/2021   Family history of colon cancer 06/08/2021   Family history of bladder cancer 06/08/2021   Family history of breast cancer 06/08/2021   Lumbar radiculopathy 08/14/2018   HTN (hypertension) 05/07/2013   Personal history of colonic polyps 05/07/2013   Osteopenia 05/07/2013   Lumbar foraminal stenosis 07/26/2012    Past Medical History:  Diagnosis Date   Anemia    Arthritis    lower anemia    Family history of adverse reaction to anesthesia    SISTER CAN'T TOLERATE MEDS   Family history of bladder cancer    Family history of breast cancer    Family history of colon cancer    GERD (gastroesophageal reflux disease)    hx of    Hay fever    SEASONAL   History of colonic polyps    HTN (hypertension)    Osteopenia 06/2019   T score -1.6 FRAX 4.5% / 0.6%.  Stable from prior DEXA    Past Surgical History:  Procedure Laterality Date   ABDOMINAL HYSTERECTOMY  1991   AGUS   BREAST BIOPSY  1998   x2 on right    CHOLECYSTECTOMY  04/28/2004   LUMBAR LAMINECTOMY/DECOMPRESSION MICRODISCECTOMY  07/25/2012   Procedure: LUMBAR LAMINECTOMY/DECOMPRESSION MICRODISCECTOMY 2 LEVELS;  Surgeon: Javier Docker, MD;  Location: [facility];  Service: Orthopedics;  Laterality: Left;  Lumbar decompression L3-L4, L4 - L5 on the Left  (X-Ray)   NECK SURGERY  07/20/2003   for pinched nerve   PARTIAL HYSTERECTOMY  1991    Current Outpatient Medications  Medication Sig Dispense Refill   fluticasone (FLONASE) 50 MCG/ACT nasal spray Place 2 sprays into the nose daily as needed for allergies. allergies     hydrochlorothiazide (HYDRODIURIL) 12.5 MG tablet Take 12.5 mg by mouth every morning.     levocetirizine (XYZAL) 5 MG tablet Take 5 mg by mouth daily as needed for allergies. As needed     lisinopril (PRINIVIL,ZESTRIL) 40 MG tablet Take 40 mg by mouth daily.     montelukast (SINGULAIR) 10 MG tablet      Multiple Vitamin (MULTIVITAMIN) tablet Take 1 tablet by mouth daily.     Omega-3 Fatty Acids (FISH OIL) 1000 MG CAPS Take 1,000 mg by mouth daily.     Rosuvastatin Calcium 5 MG CPSP      traMADol (ULTRAM) 50 MG tablet TAKE 1 TABLET BY MOUTH EVERY DAY AT BEDTIME FOR 30  DAYS     vitamin B-12 (CYANOCOBALAMIN) 1000 MCG tablet Take 1,000 mcg by mouth daily.     VITAMIN D PO Take 2,000 Int'l Units by mouth daily.     No current facility-administered medications for this visit.     ALLERGIES: Patient has no known allergies.  Family History  Problem Relation Age of Onset   Hypertension Mother    Breast cancer Mother 15   Arthritis Mother    Colon cancer Mother 17   Heart attack Father 61   Heart disease Father    Breast cancer Sister 69   Hypertension Sister    Arthritis Sister    Neuropathy Sister    Other Sister        H. Pylori   Hypertension Sister    Arthritis Sister    Heart murmur Sister    Hypertension Sister    Arthritis Sister    Heart murmur Sister    Colon cancer Sister 77   Hypertension Sister    Arthritis Sister    Breast cancer Maternal Aunt 54   Colon cancer Maternal Aunt 68   Heart disease Paternal Uncle    Throat cancer Maternal Grandmother    Bladder Cancer Maternal Grandfather    Heart disease Paternal Grandmother    Breast cancer Cousin        mat first cousin   Colon cancer Cousin         >50   Stomach cancer Cousin        >50    Social History   Socioeconomic History   Marital status: Married    Spouse name: Not on file   Number of children: 2   Years of education: Not on file   Highest education level: High school graduate  Occupational History   Not on file  Tobacco Use   Smoking status: Never   Smokeless tobacco: Never  Vaping Use   Vaping status: Never Used  Substance and Sexual Activity   Alcohol use: No    Alcohol/week: 0.0 standard drinks of alcohol   Drug use: No   Sexual activity: Not Currently    Birth control/protection: Surgical    Comment: first intercourse>16, less than 5 partners, No DES exposure  Other Topics Concern   Not on file  Social History Narrative   Lives at home with her husband   Right handed   Caffeine: 1 soda per week   Social Determinants of Health   Financial Resource Strain: Not on file  Food Insecurity: Not on file  Transportation Needs: Not on file  Physical Activity: Not on file  Stress: Not on file  Social Connections: Unknown (01/22/2022)   Received from [facility], [facility]   Social Network    Social Network: Not on file  Intimate Partner Violence: Unknown (12/14/2021)   Received from [facility], [facility]   HITS    Physically Hurt: Not on file    Insult or Talk Down To: Not on file    Threaten Physical Harm: Not on file    Scream or Curse: Not on file    Review of Systems  PHYSICAL EXAMINATION:    There were no vitals taken for this visit.    General appearance: alert, cooperative and appears stated age Head: Normocephalic, without obvious abnormality, atraumatic Neck: no adenopathy, supple, symmetrical, trachea midline and thyroid normal to inspection and palpation Lungs: clear to auscultation bilaterally Breasts: normal appearance, no masses or tenderness, No nipple retraction or dimpling,  No nipple discharge or bleeding, No axillary or supraclavicular adenopathy Heart: regular  rate and rhythm Abdomen: soft, non-tender, no masses,  no organomegaly Extremities: extremities normal, atraumatic, no cyanosis or edema Skin: Skin color, texture, turgor normal. No rashes or lesions Lymph nodes: Cervical, supraclavicular, and axillary nodes normal. No abnormal inguinal nodes palpated Neurologic: Grossly normal  Pelvic: External genitalia:  no lesions              Urethra:  normal appearing urethra with no masses, tenderness or lesions              Bartholins and Skenes: normal                 Vagina: normal appearing vagina with normal color and discharge, no lesions              Cervix: no lesions                Bimanual Exam:  Uterus:  normal size, contour, position, consistency, mobility, non-tender              Adnexa: no mass, fullness, tenderness              Rectal exam: {yes no:[id number]}.  Confirms.              Anus:  normal sphincter tone, no lesions  Chaperone was present for exam:  ***  ASSESSMENT     PLAN     An After Visit Summary was printed and given to the patient.  ______ minutes face to face time of which over 50% was spent in counseling.

## 2023-06-01 ENCOUNTER — Ambulatory Visit
Admission: RE | Admit: 2023-06-01 | Discharge: 2023-06-01 | Disposition: A | Discharge status: Home / Self Care | Payer: MEDICARE | Source: Ambulatory Visit | Attending: Family Medicine | Admitting: Family Medicine

## 2023-06-01 DIAGNOSIS — Z1231 Encounter for screening mammogram for malignant neoplasm of breast: Secondary | ICD-10-CM

## 2023-06-07 DIAGNOSIS — M5416 Radiculopathy, lumbar region: Secondary | ICD-10-CM | POA: Diagnosis not present

## 2023-06-14 ENCOUNTER — Ambulatory Visit: Payer: MEDICARE | Admitting: Obstetrics and Gynecology

## 2023-06-22 DIAGNOSIS — M5451 Vertebrogenic low back pain: Secondary | ICD-10-CM | POA: Diagnosis not present

## 2023-07-04 ENCOUNTER — Other Ambulatory Visit: Payer: Self-pay | Admitting: Obstetrics and Gynecology

## 2023-07-04 ENCOUNTER — Telehealth: Payer: Self-pay

## 2023-07-04 DIAGNOSIS — Z780 Asymptomatic menopausal state: Secondary | ICD-10-CM

## 2023-07-04 DIAGNOSIS — M8580 Other specified disorders of bone density and structure, unspecified site: Secondary | ICD-10-CM

## 2023-07-04 NOTE — Progress Notes (Signed)
[facility]

## 2023-07-04 NOTE — Telephone Encounter (Signed)
Let patient know I ordered a bone density for [facility].  She will need to call to schedule the appointment.

## 2023-07-04 NOTE — Telephone Encounter (Signed)
Patient left message on triage voicemail asking to get her bone density done at an outside site since [facility] will not be doing them at this time. [facility]. Her last BMD was done 06/26/21. Please advise.

## 2023-07-05 DIAGNOSIS — M5416 Radiculopathy, lumbar region: Secondary | ICD-10-CM | POA: Diagnosis not present

## 2023-07-05 NOTE — Telephone Encounter (Signed)
DEXA scheduled for 02/14/2024.  Routing to provider for final review and closing encounter.

## 2023-08-30 DIAGNOSIS — M50322 Other cervical disc degeneration at C5-C6 level: Secondary | ICD-10-CM | POA: Diagnosis not present

## 2023-08-30 DIAGNOSIS — M50223 Other cervical disc displacement at C6-C7 level: Secondary | ICD-10-CM | POA: Diagnosis not present

## 2023-08-30 DIAGNOSIS — M50222 Other cervical disc displacement at C5-C6 level: Secondary | ICD-10-CM | POA: Diagnosis not present

## 2023-08-30 DIAGNOSIS — D321 Benign neoplasm of spinal meninges: Secondary | ICD-10-CM | POA: Diagnosis not present

## 2023-08-30 DIAGNOSIS — M4802 Spinal stenosis, cervical region: Secondary | ICD-10-CM | POA: Diagnosis not present

## 2023-09-12 DIAGNOSIS — M810 Age-related osteoporosis without current pathological fracture: Secondary | ICD-10-CM

## 2023-09-12 HISTORY — DX: Age-related osteoporosis without current pathological fracture: M81.0

## 2023-09-17 DIAGNOSIS — M79662 Pain in left lower leg: Secondary | ICD-10-CM | POA: Diagnosis not present

## 2023-09-24 DIAGNOSIS — Z0000 Encounter for general adult medical examination without abnormal findings: Secondary | ICD-10-CM | POA: Diagnosis not present

## 2023-09-24 DIAGNOSIS — Z1331 Encounter for screening for depression: Secondary | ICD-10-CM | POA: Diagnosis not present

## 2023-09-24 DIAGNOSIS — Z6825 Body mass index (BMI) 25.0-25.9, adult: Secondary | ICD-10-CM | POA: Diagnosis not present

## 2023-10-12 DIAGNOSIS — M51369 Other intervertebral disc degeneration, lumbar region without mention of lumbar back pain or lower extremity pain: Secondary | ICD-10-CM | POA: Diagnosis not present

## 2023-10-12 DIAGNOSIS — M5451 Vertebrogenic low back pain: Secondary | ICD-10-CM | POA: Diagnosis not present

## 2023-11-27 DIAGNOSIS — G609 Hereditary and idiopathic neuropathy, unspecified: Secondary | ICD-10-CM | POA: Diagnosis not present

## 2023-11-30 DIAGNOSIS — I10 Essential (primary) hypertension: Secondary | ICD-10-CM | POA: Diagnosis not present

## 2023-12-03 DIAGNOSIS — M5451 Vertebrogenic low back pain: Secondary | ICD-10-CM | POA: Diagnosis not present

## 2023-12-03 DIAGNOSIS — M51369 Other intervertebral disc degeneration, lumbar region without mention of lumbar back pain or lower extremity pain: Secondary | ICD-10-CM | POA: Diagnosis not present

## 2023-12-10 DIAGNOSIS — E785 Hyperlipidemia, unspecified: Secondary | ICD-10-CM | POA: Diagnosis not present

## 2023-12-10 DIAGNOSIS — I10 Essential (primary) hypertension: Secondary | ICD-10-CM | POA: Diagnosis not present

## 2023-12-29 DIAGNOSIS — I10 Essential (primary) hypertension: Secondary | ICD-10-CM | POA: Diagnosis not present

## 2024-01-09 DIAGNOSIS — I10 Essential (primary) hypertension: Secondary | ICD-10-CM | POA: Diagnosis not present

## 2024-01-09 DIAGNOSIS — E785 Hyperlipidemia, unspecified: Secondary | ICD-10-CM | POA: Diagnosis not present

## 2024-01-17 ENCOUNTER — Other Ambulatory Visit: Payer: Self-pay | Admitting: Family Medicine

## 2024-01-17 DIAGNOSIS — Z1231 Encounter for screening mammogram for malignant neoplasm of breast: Secondary | ICD-10-CM

## 2024-01-28 DIAGNOSIS — I10 Essential (primary) hypertension: Secondary | ICD-10-CM | POA: Diagnosis not present

## 2024-02-07 DIAGNOSIS — Z23 Encounter for immunization: Secondary | ICD-10-CM | POA: Diagnosis not present

## 2024-02-09 DIAGNOSIS — I10 Essential (primary) hypertension: Secondary | ICD-10-CM | POA: Diagnosis not present

## 2024-02-09 DIAGNOSIS — E785 Hyperlipidemia, unspecified: Secondary | ICD-10-CM | POA: Diagnosis not present

## 2024-02-14 ENCOUNTER — Ambulatory Visit
Admission: RE | Admit: 2024-02-14 | Discharge: 2024-02-14 | Disposition: A | Discharge status: Home / Self Care | Payer: MEDICARE | Source: Ambulatory Visit | Attending: Obstetrics and Gynecology | Admitting: Obstetrics and Gynecology

## 2024-02-14 DIAGNOSIS — M8580 Other specified disorders of bone density and structure, unspecified site: Secondary | ICD-10-CM

## 2024-02-14 DIAGNOSIS — Z780 Asymptomatic menopausal state: Secondary | ICD-10-CM

## 2024-02-14 DIAGNOSIS — M8588 Other specified disorders of bone density and structure, other site: Secondary | ICD-10-CM | POA: Diagnosis not present

## 2024-02-14 DIAGNOSIS — N958 Other specified menopausal and perimenopausal disorders: Secondary | ICD-10-CM | POA: Diagnosis not present

## 2024-02-16 ENCOUNTER — Ambulatory Visit: Payer: Self-pay | Admitting: Obstetrics and Gynecology

## 2024-02-16 ENCOUNTER — Encounter: Payer: Self-pay | Admitting: Obstetrics and Gynecology

## 2024-02-27 DIAGNOSIS — I10 Essential (primary) hypertension: Secondary | ICD-10-CM | POA: Diagnosis not present

## 2024-03-05 ENCOUNTER — Encounter: Payer: Self-pay | Admitting: Neurology

## 2024-03-05 ENCOUNTER — Ambulatory Visit (INDEPENDENT_AMBULATORY_CARE_PROVIDER_SITE_OTHER): Payer: MEDICARE | Admitting: Neurology

## 2024-03-05 VITALS — BP 111/67 | HR 81 | Ht 65.0 in | Wt 147.4 lb

## 2024-03-05 DIAGNOSIS — R202 Paresthesia of skin: Secondary | ICD-10-CM | POA: Diagnosis not present

## 2024-03-05 DIAGNOSIS — E519 Thiamine deficiency, unspecified: Secondary | ICD-10-CM | POA: Diagnosis not present

## 2024-03-05 DIAGNOSIS — M5417 Radiculopathy, lumbosacral region: Secondary | ICD-10-CM | POA: Diagnosis not present

## 2024-03-05 DIAGNOSIS — E538 Deficiency of other specified B group vitamins: Secondary | ICD-10-CM

## 2024-03-05 DIAGNOSIS — R7309 Other abnormal glucose: Secondary | ICD-10-CM

## 2024-03-05 DIAGNOSIS — E531 Pyridoxine deficiency: Secondary | ICD-10-CM | POA: Diagnosis not present

## 2024-03-05 NOTE — Progress Notes (Signed)
 [facility]    Provider:  Dr Ines Requesting Provider: Duwayne Purchase, MD Primary Care Provider:  Cleotilde Planas, MD  CC:  left leg sensory symptoms  HPI:  Katherine Fisher is a 73 y.o. female here as requested by Duwayne Purchase, MD for peripheral neuropathy. has Lumbar foraminal stenosis; HTN (hypertension); History of colonic polyps; Osteopenia; Lumbosacral radiculopathy at L5; Family history of colon cancer; Family history of bladder cancer; Family history of breast cancer; and Genetic testing on their problem list. Referral came from orthopaedics, was able to access pcp [facility] notes Dr. Cleotilde on care everywhere 09/24/2023 hx of [facility] Lumbar decompression L3-4, foraminotomies L4, lysis of adhesions L4-5 [place]. I saw her in 2019 for similar, diagnosed with lumbar radiculopathy and less likely idiopathic peripheral neuropathy  We have evaluated her in the past. She cannot lay on the left side. She has back pain and shooting pain down the left leg. Always been in the left leg in the setting of back pain. We evaluated her in the past, The pain is in the left lateral shin and top of the foot. Not in the right foot. My emg/ncs. She has a sister with neuropathy. In September through February it can be worse, it bothered her this morning, march through April wsant so bad only occurred a few distinct times. She is taking B12 supplementation. Denies sensory changes in both feet. Gabapentin  helps. Pain is worse she can feel it in the low back and sometimes she can hardly lift her leg up due to back pain and she feels the burning in the left lateral shin below the knee and above the ankle, sometimes radiating to the top of the toes but can go months without this pain. Exacerbated by walkng and movement and associated with low back pain.   Reviewed notes, labs and imaging from outside physicians, which showed:  I reviewed Dr. Lunette EMG nerve conduction study images and results which  showed prolonged latencies on the peroneal motor nerves, unremarkable tibials, absent sensory conductions, normal EMG  Personally reviewed images and agree with the following:  FINDINGS:  The lumbar vertebrae demonstrate exaggerated forward lordotic curvature with mild anterior subluxation of L4 over L5 vertebrae. There are changes of partial laminectomy on the left at L3-4 and L4-5. The intervertebral discs show minor degenerative changes throughout. T12-L1 and L1-L2 show minor facet hypertrophy resulting in mild posterior canal narrowing but no significant compression.A2-3 show similar changes with mild foraminal narrowing on the left but no compression.L3-4 shows prominent facet and mild ligamentum flavum hypertrophy resulting in mild bilateral foraminal and posterior canal narrowing but no definite root impingement.L4-5 shows prominent facet hypertrophy resulting in moderate bilateral foraminal narrowing but definite impingement on the exiting nerve root cannot be made out. L5-S1 shows asymmetric facet hypertrophy right greater than left resulting in mild right-sided foraminal narrowing but no definite root impingement. The visualized paraspinal soft tissues appear unremarkable. Visualized lower thoracic vertebrae show minor degenerative changes as well.   IMPRESSION:  Abnormal MRI scan of the lumbar spine showing prominent facet hypertrophic changes resulting in moderate bilateral foraminal narrowing at L4-5 and mild bilateral foraminal narrowing at L3-4.   Katherine Fisher  Review of Systems: Patient complains of symptoms per HPI as well as the following symptoms none. Pertinent negatives and positives per HPI. All others negative.   Social History   Socioeconomic History   Marital status: Married    Spouse name: Not on file   Number of children: 2  Years of education: Not on file   Highest education level: High school graduate  Occupational History   Not on file  Tobacco Use   Smoking status:  Never   Smokeless tobacco: Never  Vaping Use   Vaping status: Never Used  Substance and Sexual Activity   Alcohol use: No    Alcohol/week: 0.0 standard drinks of alcohol   Drug use: No   Sexual activity: Not Currently    Birth control/protection: Surgical    Comment: first intercourse>16, less than 5 partners, No DES exposure  Other Topics Concern   Not on file  Social History Narrative   Lives at home with her husband   Right handed   Caffeine: 1 soda per week   Retired    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 Strain: Not on file  Food Insecurity: Not on file  Transportation Needs: Not on file  Physical Activity: Not on file  Stress: Not on file  Social Connections: Unknown (01/22/2022)   Received from [facility]   Social Network    Social Network: Not on file  Intimate Partner Violence: Unknown (12/14/2021)   Received from [facility]   HITS    Physically Hurt: Not on file    Insult or Talk Down To: Not on file    Threaten Physical Harm: Not on file    Scream or Curse: Not on file    Family History  Problem Relation Age of Onset   Hypertension Mother    Breast cancer Mother 83   Arthritis Mother    Colon cancer Mother 34   Heart attack Father 32   Heart disease Father    Breast cancer Sister 71   Hypertension Sister    Arthritis Sister    Neuropathy Sister    Other Sister        H. Pylori   Hypertension Sister    Arthritis Sister    Heart murmur Sister    Hypertension Sister    Arthritis Sister    Heart murmur Sister    Colon cancer Sister 67   Hypertension Sister    Arthritis Sister    Breast cancer Maternal Aunt 65   Colon cancer Maternal Aunt 68   Heart disease Paternal Uncle    Throat cancer Maternal Grandmother    Bladder Cancer Maternal Grandfather    Heart disease Paternal Grandmother    Breast cancer Cousin        mat first cousin   Colon cancer Cousin        >50   Stomach cancer Cousin        >50    Past Medical History:   Diagnosis Date   Anemia    Arthritis    lower anemia    Family history of adverse reaction to anesthesia    SISTER CAN'T TOLERATE MEDS   Family history of bladder cancer    Family history of breast cancer    Family history of colon cancer    GERD (gastroesophageal reflux disease)    hx of    Hay fever    SEASONAL   History of colonic polyps    HTN (hypertension)    Osteopenia 06/2019   T score -1.6 FRAX 4.5% / 0.6%.  Stable from prior DEXA   Osteoporosis 2025   bilateral hips    Patient Active Problem List   Diagnosis Date Noted   Genetic testing 06/23/2021   Family history of colon cancer 06/08/2021  Family history of bladder cancer 06/08/2021   Family history of breast cancer 06/08/2021   Lumbosacral radiculopathy at L5 08/14/2018   HTN (hypertension) 05/07/2013   History of colonic polyps 05/07/2013   Osteopenia 05/07/2013   Lumbar foraminal stenosis 07/26/2012    Past Surgical History:  Procedure Laterality Date   ABDOMINAL HYSTERECTOMY  1991   AGUS   BREAST BIOPSY  1998   x2 on right    CHOLECYSTECTOMY  04/28/2004   LUMBAR LAMINECTOMY/DECOMPRESSION MICRODISCECTOMY  07/25/2012   Procedure: LUMBAR LAMINECTOMY/DECOMPRESSION MICRODISCECTOMY 2 LEVELS;  Surgeon: Reyes JAYSON Billing, MD;  Location: [facility];  Service: Orthopedics;  Laterality: Left;  Lumbar decompression L3-L4, L4 - L5 on the Left (X-Ray)   NECK SURGERY  07/20/2003   for pinched nerve   PARTIAL HYSTERECTOMY  1991    Current Outpatient Medications  Medication Sig Dispense Refill   fluticasone (FLONASE) 50 MCG/ACT nasal spray Place 2 sprays into the nose daily as needed for allergies. allergies     gabapentin  (NEURONTIN ) 100 MG capsule 100 mg daily.     hydrochlorothiazide (HYDRODIURIL) 12.5 MG tablet Take 12.5 mg by mouth every morning.     levocetirizine (XYZAL ) 5 MG tablet Take 5 mg by mouth daily as needed for allergies. As needed     lisinopril (PRINIVIL,ZESTRIL) 40 MG tablet Take 40 mg by mouth  daily.     montelukast (SINGULAIR) 10 MG tablet      Multiple Vitamin (MULTIVITAMIN) tablet Take 1 tablet by mouth daily.     Omega-3 Fatty Acids (FISH OIL) 1000 MG CAPS Take 1,000 mg by mouth daily.     Rosuvastatin Calcium 5 MG CPSP      traMADol (ULTRAM) 50 MG tablet TAKE 1 TABLET BY MOUTH EVERY DAY AT BEDTIME FOR 30 DAYS     vitamin B-12 (CYANOCOBALAMIN ) 1000 MCG tablet Take 1,000 mcg by mouth daily.     VITAMIN D  PO Take 2,000 Int'l Units by mouth daily.     No current facility-administered medications for this visit.    Allergies as of 03/05/2024   (No Known Allergies)    Vitals: BP 111/67   Pulse 81   Ht 5' 5 (1.651 m)   Wt 147 lb 6.4 oz (66.9 kg)   BMI 24.53 kg/m  Last Weight:  Wt Readings from Last 1 Encounters:  03/05/24 147 lb 6.4 oz (66.9 kg)   Last Height:   Ht Readings from Last 1 Encounters:  03/05/24 5' 5 (1.651 m)     Physical exam: Exam: Gen: NAD, conversant, well nourised, obese, well groomed                     CV: RRR, no MRG. No Carotid Bruits. No peripheral edema, warm, nontender Eyes: Conjunctivae clear without exudates or hemorrhage  Neuro: Detailed Neurologic Exam  Speech:    Speech is normal; fluent and spontaneous with normal comprehension.  Cognition:    The patient is oriented to person, place, and time;     recent and remote memory intact;     language fluent;     normal attention, concentration,     fund of knowledge Cranial Nerves:    The pupils are equal, round, and reactive to light. The fundi are normal and spontaneous venous pulsations are present. Visual fields are full to finger confrontation. Extraocular movements are intact. Trigeminal sensation is intact and the muscles of mastication are normal. The face is symmetric. The palate elevates in the midline. Hearing  intact. Voice is normal. Shoulder shrug is normal. The tongue has normal motion without fasciculations.   Coordination:    Normal finger to nose and heel to  shin. Normal rapid alternating movements.   Gait:    Heel-toe and tandem gait are normal.   Motor Observation:    No asymmetry, no atrophy, and no involuntary movements noted. Tone:    Normal muscle tone.    Posture:    Posture is normal. normal erect    Strength: weakness left leg flexion. Otherwise strength is V/V in the upper and lower limbs.      Sensation: decreased cold in the left lateral shin area but intact pp, cold, vibration distally     Reflex Exam:  DTR's: decreased left AJ     Deep tendon reflexes in the upper and lower extremities are normal bilaterally.   Toes:    The toes are downgoing bilaterally.   Clonus:    Clonus is absent.    Assessment/Plan: This is a lovely patient with left L5 radiculopathy.  She has sensory changes in the left lower extremity in an L5 distribution in the setting of radiculopathy and low back pain; she has weakness in the left leg in an L5 sensory distribution; left ankle jerk is decreased as compared to the right.  Otherwise examination is not supportive of a peripheral polyneuropathy in fact distally examination is normal to vibration, pinprick, cold, proprioception and light touch in the feet.  Nerve conduction study showed absent sensory conductions, IF the EMG nerve conduction study is accurate she is asymptomatic for peripheral polyneuropathy and all her symptoms are consistent with L5 radiculopathy.  I will order follow-up testing for other causes of peripheral neuropathy just to ensure we do not miss anything like B12 deficiency.  Orders Placed This Encounter  Procedures   B12 and Folate Panel   Methylmalonic acid, serum   Vitamin B1   Vitamin B6   Hemoglobin A1c   Multiple Myeloma Panel (SPEP&IFE w/QIG)   No orders of the defined types were placed in this encounter.   Cc: Duwayne Purchase, MD,  Cleotilde Planas, MD  Onetha Epp, MD  [facility] [address] [place], [place]  [address]  Phone [phone] Fax [phone]

## 2024-03-05 NOTE — Patient Instructions (Signed)
 Peripheral Neuropathy Peripheral neuropathy is a type of nerve damage. It affects nerves that carry signals between the spinal cord and the arms, legs, and the rest of the body (peripheral nerves). It does not affect nerves in the spinal cord or brain. In peripheral neuropathy, one nerve or a group of nerves may be damaged. Peripheral neuropathy is a broad category that includes many specific nerve disorders, like diabetic neuropathy, hereditary neuropathy, and carpal tunnel syndrome. What are the causes? This condition may be caused by: Certain diseases, such as: Diabetes. This is the most common cause of peripheral neuropathy. Autoimmune diseases, such as rheumatoid arthritis and systemic lupus erythematosus. Nerve diseases that are passed from parent to child (inherited). Kidney disease. Thyroid disease. Other causes may include: Nerve injury. Pressure or stress on a nerve that lasts a long time. Lack (deficiency) of B vitamins. This can result from alcoholism, poor diet, or a restricted diet. Infections. Some medicines, such as cancer medicines (chemotherapy). Poisonous (toxic) substances, such as lead and mercury. Too little blood flowing to the legs. In some cases, the cause of this condition is not known. What are the signs or symptoms? Symptoms of this condition depend on which of your nerves is damaged. Symptoms in the legs, hands, and arms can include: Loss of feeling (numbness) in the feet, hands, or both. Tingling in the feet, hands, or both. Burning pain. Very sensitive skin. Weakness. Not being able to move a part of the body (paralysis). Clumsiness or poor coordination. Muscle twitching. Loss of balance. Symptoms in other parts of the body can include: Not being able to control your bladder. Feeling dizzy. Sexual problems. How is this diagnosed? Diagnosing and finding the cause of peripheral neuropathy can be difficult. Your health care provider will take your  medical history and do a physical exam. A neurological exam will also be done. This involves checking things that are affected by your brain, spinal cord, and nerves (nervous system). For example, your health care provider will check your reflexes, how you move, and what you can feel. You may have other tests, such as: Blood tests. Electromyogram (EMG) and nerve conduction tests. These tests check nerve function and how well the nerves are controlling the muscles. Imaging tests, such as a CT scan or MRI, to rule out other causes of your symptoms. Removing a small piece of nerve to be examined in a lab (nerve biopsy). Removing and examining a small amount of the fluid that surrounds the brain and spinal cord (lumbar puncture). How is this treated? Treatment for this condition may involve: Treating the underlying cause of the neuropathy, such as diabetes, kidney disease, or vitamin deficiencies. Stopping medicines that can cause neuropathy, such as chemotherapy. Medicine to help relieve pain. Medicines may include: Prescription or over-the-counter pain medicine. Anti-seizure medicine. Antidepressants. Pain-relieving patches that are applied to painful areas of skin. Surgery to relieve pressure on a nerve or to destroy a nerve that is causing pain. Physical therapy to help improve movement and balance. Devices to help you move around (assistive devices). Follow these instructions at home: Medicines Take over-the-counter and prescription medicines only as told by your health care provider. Do not take any other medicines without first asking your health care provider. Ask your health care provider if the medicine prescribed to you requires you to avoid driving or using machinery. Lifestyle  Do not use any products that contain nicotine or tobacco. These products include cigarettes, chewing tobacco, and vaping devices, such as e-cigarettes. Smoking keeps  blood from reaching damaged nerves. If you  need help quitting, ask your health care provider. Avoid or limit alcohol. Too much alcohol can cause a vitamin B deficiency, and vitamin B is needed for healthy nerves. Eat a healthy diet. This includes: Eating foods that are high in fiber, such as beans, whole grains, and fresh fruits and vegetables. Limiting foods that are high in fat and processed sugars, such as fried or sweet foods. General instructions  If you have diabetes, work closely with your health care provider to keep your blood sugar under control. If you have numbness in your feet: Check every day for signs of injury or infection. Watch for redness, warmth, and swelling. Wear padded socks and comfortable shoes. These help protect your feet. Develop a good support system. Living with peripheral neuropathy can be stressful. Consider talking with a mental health specialist or joining a support group. Use assistive devices and attend physical therapy as told by your health care provider. This may include using a walker or a cane. Keep all follow-up visits. This is important. Where to find more information [employer] of Neurological Disorders: [employer] Contact a health care provider if: You have new signs or symptoms of peripheral neuropathy. You are struggling emotionally from dealing with peripheral neuropathy. Your pain is not well controlled. Get help right away if: You have an injury or infection that is not healing normally. You develop new weakness in an arm or leg. You have fallen or do so frequently. Summary Peripheral neuropathy is when the nerves in the arms or legs are damaged, resulting in numbness, weakness, or pain. There are many causes of peripheral neuropathy, including diabetes, pinched nerves, vitamin deficiencies, autoimmune disease, and hereditary conditions. Diagnosing and finding the cause of peripheral neuropathy can be difficult. Your health care provider will take your medical  history, do a physical exam, and do tests, including blood tests and nerve function tests. Treatment involves treating the underlying cause of the neuropathy and taking medicines to help control pain. Physical therapy and assistive devices may also help. This information is not intended to replace advice given to you by your health care provider. Make sure you discuss any questions you have with your health care provider. Document Revised: 05/03/2021 Document Reviewed: 05/03/2021 Elsevier Patient Education  2024 [employer].

## 2024-03-06 LAB — VITAMIN B1

## 2024-03-06 LAB — MULTIPLE MYELOMA PANEL, SERUM

## 2024-03-09 LAB — MULTIPLE MYELOMA PANEL, SERUM
Albumin SerPl Elph-Mcnc: 3.8 g/dL (ref 2.9–4.4)
Albumin/Glob SerPl: 1.1 (ref 0.7–1.7)
Alpha 1: 0.2 g/dL (ref 0.0–0.4)
Alpha2 Glob SerPl Elph-Mcnc: 0.8 g/dL (ref 0.4–1.0)
B-Globulin SerPl Elph-Mcnc: 1.1 g/dL (ref 0.7–1.3)
Gamma Glob SerPl Elph-Mcnc: 1.5 g/dL (ref 0.4–1.8)
Globulin, Total: 3.7 g/dL (ref 2.2–3.9)
IgA/Immunoglobulin A, Serum: 459 mg/dL — AB (ref 64–422)
IgM (Immunoglobulin M), Srm: 63 mg/dL (ref 26–217)
Total Protein: 7.5 g/dL (ref 6.0–8.5)

## 2024-03-09 LAB — HEMOGLOBIN A1C
Est. average glucose Bld gHb Est-mCnc: 114 mg/dL
Hgb A1c MFr Bld: 5.6 % (ref 4.8–5.6)

## 2024-03-09 LAB — B12 AND FOLATE PANEL
Folate: >20 ng/mL (ref >3.0)
Vitamin B-12: >2000 pg/mL — ABNORMAL HIGH (ref 232–1245)

## 2024-03-09 LAB — METHYLMALONIC ACID, SERUM: Methylmalonic Acid: 183 nmol/L (ref 0–378)

## 2024-03-09 LAB — VITAMIN B6: Vitamin B6: 46.9 ug/L (ref 3.4–65.2)

## 2024-03-10 ENCOUNTER — Ambulatory Visit: Payer: Self-pay | Admitting: Neurology

## 2024-03-10 DIAGNOSIS — E785 Hyperlipidemia, unspecified: Secondary | ICD-10-CM | POA: Diagnosis not present

## 2024-03-10 DIAGNOSIS — I10 Essential (primary) hypertension: Secondary | ICD-10-CM | POA: Diagnosis not present

## 2024-03-26 ENCOUNTER — Encounter: Payer: Self-pay | Admitting: Obstetrics and Gynecology

## 2024-03-26 ENCOUNTER — Ambulatory Visit (INDEPENDENT_AMBULATORY_CARE_PROVIDER_SITE_OTHER): Payer: MEDICARE | Admitting: Obstetrics and Gynecology

## 2024-03-26 VITALS — BP 122/76 | HR 83

## 2024-03-26 DIAGNOSIS — E559 Vitamin D deficiency, unspecified: Secondary | ICD-10-CM | POA: Diagnosis not present

## 2024-03-26 DIAGNOSIS — I10 Essential (primary) hypertension: Secondary | ICD-10-CM | POA: Diagnosis not present

## 2024-03-26 DIAGNOSIS — M810 Age-related osteoporosis without current pathological fracture: Secondary | ICD-10-CM

## 2024-03-26 DIAGNOSIS — E785 Hyperlipidemia, unspecified: Secondary | ICD-10-CM | POA: Diagnosis not present

## 2024-03-26 NOTE — Progress Notes (Unsigned)
 GYNECOLOGY  VISIT   HPI: 73 y.o.   Married  [place] female   G2P2 with No LMP recorded. Patient has had a hysterectomy.   here for: Discuss dexa results.    Has left hip pain.   X-ray 03/27/23 - no fracture of hip or pelvis.    No prior fracture.    No upcoming dental procedures.   Some TMJ. Uses a night guard in her mouth.    Some dyspepsia.  No medication use.    Study Result  Narrative & Impression  EXAM: DUAL X-RAY ABSORPTIOMETRY (DXA) FOR BONE MINERAL DENSITY 02/14/2024 9:10 am   CLINICAL DATA:  73 year old Female Postmenopausal. Screening for osteoporosis   TECHNIQUE: An axial (e.g., hips, spine) and/or appendicular (e.g., radius) exam was performed, as appropriate, using GE Secretary/administrator at [employer] [facility]. Images are obtained for bone mineral density measurement and are not obtained for diagnostic purposes. [id number]   Exclusions: L1 due to degenerative changes   COMPARISON:  None. New baseline.   FINDINGS: Scan quality: Good.   LUMBAR SPINE (L2-L4):   BMD (in g/cm2): 1.080   T-score: -1.0   Z-score: 0.0   LEFT FEMORAL NECK:   BMD (in g/cm2): 0.683   T-score: -2.6   Z-score: -1.7   LEFT TOTAL HIP:   BMD (in g/cm2): 0.785   T-score: -1.8   Z-score: -1.1   RIGHT FEMORAL NECK:   BMD (in g/cm2): 0.693   T-score: -2.5   Z-score: -1.6   RIGHT TOTAL HIP:   BMD (in g/cm2): 0.807   T-score: -1.6   Z-score: -1.0   FRAX 10-YEAR PROBABILITY OF FRACTURE:   FRAX not reported as the lowest BMD is not in the osteopenia range.   IMPRESSION: Osteoporosis based on BMD.   Fracture risk is increased. Increased risk is based on low BMD.   RECOMMENDATIONS: 1. All patients should optimize calcium and vitamin D  intake.   2. Consider FDA-approved medical therapies in postmenopausal women and men aged 6 years and older, based on the following:   - A hip or vertebral (clinical or  morphometric) fracture   - T-score less than or equal to -2.5 and secondary causes have been excluded.   - Low bone mass (T-score between -1.0 and -2.5) and a 10-year probability of a hip fracture greater than or equal to 3% or a 10-year probability of a major osteoporosis-related fracture greater than or equal to 20% based on the [place] -adapted WHO algorithm.   - Clinician judgment and/or patient preferences may indicate treatment for people with 10-year fracture probabilities above or below these levels   3. Patients with diagnosis of osteoporosis or at high risk for fracture should have regular bone mineral density tests. For patients eligible for Medicare, routine testing is allowed once every 2 years. The testing frequency can be increased to one year for patients who have rapidly progressing disease, those who are receiving or discontinuing medical therapy to restore bone mass, or have additional risk factors.     Electronically Signed   By: Alm Parkins M.D.   On: 02/14/2024 13:34      GYNECOLOGIC HISTORY: No LMP recorded. Patient has had a hysterectomy. Contraception:  PMP Menopausal hormone therapy:  n/a Last 2 paps:  06/02/21 neg HR HPV neg, 05/27/19 neg  History of abnormal Pap or positive HPV:  no Mammogram:  06/01/23 Breast Density Cat B, BIRADS Cat 1 neg  OB History     Gravida  2   Para  2   Term      Preterm      AB      Living  2      SAB      IAB      Ectopic      Multiple      Live Births                 Patient Active Problem List   Diagnosis Date Noted   Genetic testing 06/23/2021   Family history of colon cancer 06/08/2021   Family history of bladder cancer 06/08/2021   Family history of breast cancer 06/08/2021   Lumbosacral radiculopathy at L5 08/14/2018   HTN (hypertension) 05/07/2013   History of colonic polyps 05/07/2013   Osteopenia 05/07/2013   Lumbar foraminal stenosis 07/26/2012    Past Medical History:   Diagnosis Date   Anemia    Arthritis    lower anemia    Family history of adverse reaction to anesthesia    SISTER CAN'T TOLERATE MEDS   Family history of bladder cancer    Family history of breast cancer    Family history of colon cancer    GERD (gastroesophageal reflux disease)    hx of    Hay fever    SEASONAL   History of colonic polyps    HTN (hypertension)    Osteopenia 06/2019   T score -1.6 FRAX 4.5% / 0.6%.  Stable from prior DEXA   Osteoporosis 2025   bilateral hips    Past Surgical History:  Procedure Laterality Date   ABDOMINAL HYSTERECTOMY  1991   AGUS   BREAST BIOPSY  1998   x2 on right    CHOLECYSTECTOMY  04/28/2004   LUMBAR LAMINECTOMY/DECOMPRESSION MICRODISCECTOMY  07/25/2012   Procedure: LUMBAR LAMINECTOMY/DECOMPRESSION MICRODISCECTOMY 2 LEVELS;  Surgeon: Reyes JAYSON Billing, MD;  Location: [facility];  Service: Orthopedics;  Laterality: Left;  Lumbar decompression L3-L4, L4 - L5 on the Left (X-Ray)   NECK SURGERY  07/20/2003   for pinched nerve   PARTIAL HYSTERECTOMY  1991    Current Outpatient Medications  Medication Sig Dispense Refill   fluticasone (FLONASE) 50 MCG/ACT nasal spray Place 2 sprays into the nose daily as needed for allergies. allergies     gabapentin  (NEURONTIN ) 100 MG capsule 100 mg daily.     hydrochlorothiazide (HYDRODIURIL) 12.5 MG tablet Take 12.5 mg by mouth every morning.     levocetirizine (XYZAL ) 5 MG tablet Take 5 mg by mouth daily as needed for allergies. As needed     lisinopril (PRINIVIL,ZESTRIL) 40 MG tablet Take 40 mg by mouth daily.     montelukast (SINGULAIR) 10 MG tablet      Multiple Vitamin (MULTIVITAMIN) tablet Take 1 tablet by mouth daily.     Omega-3 Fatty Acids (FISH OIL) 1000 MG CAPS Take 1,000 mg by mouth daily.     Rosuvastatin Calcium 5 MG CPSP      traMADol (ULTRAM) 50 MG tablet TAKE 1 TABLET BY MOUTH EVERY DAY AT BEDTIME FOR 30 DAYS     vitamin B-12 (CYANOCOBALAMIN ) 1000 MCG tablet Take 1,000 mcg by mouth  daily.     VITAMIN D  PO Take 2,000 Int'l Units by mouth daily.     No current facility-administered medications for this visit.     ALLERGIES: Patient has no known allergies.  Family History  Problem Relation Age of Onset   Hypertension Mother  Breast cancer Mother 19   Arthritis Mother    Colon cancer Mother 42   Heart attack Father 69   Heart disease Father    Breast cancer Sister 59   Hypertension Sister    Arthritis Sister    Neuropathy Sister    Other Sister        H. Pylori   Hypertension Sister    Arthritis Sister    Heart murmur Sister    Hypertension Sister    Arthritis Sister    Heart murmur Sister    Colon cancer Sister 71   Hypertension Sister    Arthritis Sister    Breast cancer Maternal Aunt 50   Colon cancer Maternal Aunt 68   Heart disease Paternal Uncle    Throat cancer Maternal Grandmother    Bladder Cancer Maternal Grandfather    Heart disease Paternal Grandmother    Breast cancer Cousin        mat first cousin   Colon cancer Cousin        >50   Stomach cancer Cousin        >50    Social History   Socioeconomic History   Marital status: Married    Spouse name: Not on file   Number of children: 2   Years of education: Not on file   Highest education level: High school graduate  Occupational History   Not on file  Tobacco Use   Smoking status: Never   Smokeless tobacco: Never  Vaping Use   Vaping status: Never Used  Substance and Sexual Activity   Alcohol use: No    Alcohol/week: 0.0 standard drinks of alcohol   Drug use: No   Sexual activity: Not Currently    Birth control/protection: Surgical    Comment: first intercourse after 16, less than 5 partners, No DES exposure  Other Topics Concern   Not on file  Social History Narrative   Lives at home with her husband   Right handed   Caffeine: 1 soda per week   Retired    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[employer] Strain: Not on file  Food Insecurity: Not on file   Transportation Needs: Not on file  Physical Activity: Not on file  Stress: Not on file  Social Connections: Unknown (01/22/2022)   Received from [facility]   Social Network    Social Network: Not on file  Intimate Partner Violence: Unknown (12/14/2021)   Received from [facility]   HITS    Physically Hurt: Not on file    Insult or Talk Down To: Not on file    Threaten Physical Harm: Not on file    Scream or Curse: Not on file    Review of Systems  All other systems reviewed and are negative.   PHYSICAL EXAMINATION:   BP 122/76 (BP Location: Left Arm, Patient Position: Sitting)   Pulse 83   SpO2 98%     General appearance: alert, cooperative and appears stated age Head: Normocephalic, without obvious abnormality, atraumatic Neck: no adenopathy, supple, symmetrical, trachea midline and thyroid  normal to inspection and palpation Lungs: clear to auscultation bilaterally Breasts: normal appearance, no masses or tenderness, No nipple retraction or dimpling, No nipple discharge or bleeding, No axillary or supraclavicular adenopathy Heart: regular rate and rhythm Abdomen: soft, non-tender, no masses,  no organomegaly Extremities: extremities normal, atraumatic, no cyanosis or edema Skin: Skin color, texture, turgor normal. No rashes or lesions Lymph nodes: Cervical, supraclavicular, and axillary nodes  normal. No abnormal inguinal nodes palpated Neurologic: Grossly normal  Pelvic: External genitalia:  no lesions              Urethra:  normal appearing urethra with no masses, tenderness or lesions              Bartholins and Skenes: normal                 Vagina: normal appearing vagina with normal color and discharge, no lesions              Cervix: no lesions                Bimanual Exam:  Uterus:  normal size, contour, position, consistency, mobility, non-tender              Adnexa: no mass, fullness, tenderness              Rectal exam: {yes no:314532}.  Confirms.               Anus:  normal sphincter tone, no lesions  Chaperone was present for exam:  {BSCHAPERONE:31226::Emily F, CMA}  ASSESSMENT:  FH breast cancer in mother and sister.   Neg genetic testing.  FH heart disease.  Has hiatal hernia.    PLAN:  Prolia versus Reclast.  Reclast.   {LABS (Optional):23779}  ***  total time was spent for this patient encounter, including preparation, face-to-face counseling with the patient, coordination of care, and documentation of the encounter.

## 2024-03-26 NOTE — Patient Instructions (Signed)
Denosumab Injection (Osteoporosis) What is this medication? DENOSUMAB (den oh SUE mab) prevents and treats osteoporosis. It works by Interior and spatial designer stronger and less likely to break (fracture). It is a monoclonal antibody. This medicine may be used for other purposes; ask your health care provider or pharmacist if you have questions. COMMON BRAND NAME(S): Prolia What should I tell my care team before I take this medication? They need to know if you have any of these conditions: Dental or gum disease Had thyroid or parathyroid (glands located in neck) surgery Having dental surgery or a tooth pulled Kidney disease Low levels of calcium in the blood On dialysis Poor nutrition Thyroid disease Trouble absorbing nutrients from your food An unusual or allergic reaction to denosumab, other medications, foods, dyes, or preservatives Pregnant or trying to get pregnant Breastfeeding How should I use this medication? This medication is injected under the skin. It is given by your care team in a hospital or clinic setting. A special MedGuide will be given to you before each treatment. Be sure to read this information carefully each time. Talk to your care team about the use of this medication in children. Special care may be needed. Overdosage: If you think you have taken too much of this medicine contact a poison control center or emergency room at once. NOTE: This medicine is only for you. Do not share this medicine with others. What if I miss a dose? Keep appointments for follow-up doses. It is important not to miss your dose. Call your care team if you are unable to keep an appointment. What may interact with this medication? Do not take this medication with any of the following: Other medications that contain denosumab This medication may also interact with the following: Medications that lower your chance of fighting infection Steroid medications, such as prednisone or cortisone This  list may not describe all possible interactions. Give your health care provider a list of all the medicines, herbs, non-prescription drugs, or dietary supplements you use. Also tell them if you smoke, drink alcohol, or use illegal drugs. Some items may interact with your medicine. What should I watch for while using this medication? Your condition will be monitored carefully while you are receiving this medication. You may need blood work done while taking this medication. This medication may increase your risk of getting an infection. Call your care team for advice if you get a fever, chills, sore throat, or other symptoms of a cold or flu. Do not treat yourself. Try to avoid being around people who are sick. Tell your dentist and dental surgeon that you are taking this medication. You should not have major dental surgery while on this medication. See your dentist to have a dental exam and fix any dental problems before starting this medication. Take good care of your teeth while on this medication. Make sure you see your dentist for regular follow-up appointments. This medication may cause low levels of calcium in your body. The risk of severe side effects is increased in people with kidney disease. Your care team may prescribe calcium and vitamin D to help prevent low calcium levels while you take this medication. It is important to take calcium and vitamin D as directed by your care team. Talk to your care team if you may be pregnant. Serious birth defects may occur if you take this medication during pregnancy and for 5 months after the last dose. You will need a negative pregnancy test before starting this medication. Contraception  is recommended while taking this medication and for 5 months after the last dose. Your care team can help you find the option that works for you. Talk to your care team before breastfeeding. Changes to your treatment plan may be needed. What side effects may I notice from  receiving this medication? Side effects that you should report to your care team as soon as possible: Allergic reactions--skin rash, itching, hives, swelling of the face, lips, tongue, or throat Infection--fever, chills, cough, sore throat, wounds that don't heal, pain or trouble when passing urine, general feeling of discomfort or being unwell Low calcium level--muscle pain or cramps, confusion, tingling, or numbness in the hands or feet Osteonecrosis of the jaw--pain, swelling, or redness in the mouth, numbness of the jaw, poor healing after dental work, unusual discharge from the mouth, visible bones in the mouth Severe bone, joint, or muscle pain Skin infection--skin redness, swelling, warmth, or pain Side effects that usually do not require medical attention (report these to your care team if they continue or are bothersome): Back pain Headache Joint pain Muscle pain Pain in the hands, arms, legs, or feet Runny or stuffy nose Sore throat This list may not describe all possible side effects. Call your doctor for medical advice about side effects. You may report side effects to FDA at 1-800-FDA-1088. Where should I keep my medication? This medication is given in a hospital or clinic. It will not be stored at home. NOTE: This sheet is a summary. It may not cover all possible information. If you have questions about this medicine, talk to your doctor, pharmacist, or health care provider.  2024 Elsevier/Gold Standard (2022-10-03 00:00:00)   Zoledronic Acid Injection (Bone Disorders) What is this medication? ZOLEDRONIC ACID (ZOE le dron ik AS id) prevents and treats osteoporosis. It may also be used to treat Paget's disease of the bone. It works by Interior and spatial designer stronger and less likely to break (fracture). It belongs to a group of medications called bisphosphonates. This medicine may be used for other purposes; ask your health care provider or pharmacist if you have questions. COMMON  BRAND NAME(S): Reclast What should I tell my care team before I take this medication? They need to know if you have any of these conditions: Bleeding disorder Cancer Dental disease Kidney disease Low levels of calcium in the blood Low red blood cell counts Lung or breathing disease, such as asthma Receiving steroids, such as dexamethasone or prednisone An unusual or allergic reaction to zoledronic acid, other medications, foods, dyes, or preservatives Pregnant or trying to get pregnant Breast-feeding How should I use this medication? This medication is injected into a vein. It is given by your care team in a hospital or clinic setting. A special MedGuide will be given to you before each treatment. Be sure to read this information carefully each time. Talk to your care team about the use of this medication in children. Special care may be needed. Overdosage: If you think you have taken too much of this medicine contact a poison control center or emergency room at once. NOTE: This medicine is only for you. Do not share this medicine with others. What if I miss a dose? Keep appointments for follow-up doses. It is important not to miss your dose. Call your care team if you are unable to keep an appointment. What may interact with this medication? Certain antibiotics given by injection Medications for pain and inflammation, such as ibuprofen, naproxen, NSAIDs Some diuretics, such as bumetanide,  furosemide Teriparatide This list may not describe all possible interactions. Give your health care provider a list of all the medicines, herbs, non-prescription drugs, or dietary supplements you use. Also tell them if you smoke, drink alcohol, or use illegal drugs. Some items may interact with your medicine. What should I watch for while using this medication? Visit your care team for regular checks on your progress. It may be some time before you see the benefit from this medication. Some people who  take this medication have severe bone, joint, or muscle pain. This medication may also increase your risk for jaw problems or a broken thigh bone. Tell your care team right away if you have severe pain in your jaw, bones, joints, or muscles. Tell your care team if you have any pain that does not go away or that gets worse. You should make sure you get enough calcium and vitamin D while you are taking this medication. Discuss the foods you eat and the vitamins you take with your care team. You may need bloodwork while taking this medication. Tell your dentist and dental surgeon that you are taking this medication. You should not have major dental surgery while on this medication. See your dentist to have a dental exam and fix any dental problems before starting this medication. Take good care of your teeth while on this medication. Make sure you see your dentist for regular follow-up appointments. What side effects may I notice from receiving this medication? Side effects that you should report to your care team as soon as possible: Allergic reactions--skin rash, itching, hives, swelling of the face, lips, tongue, or throat Kidney injury--decrease in the amount of urine, swelling of the ankles, hands, or feet Low calcium level--muscle pain or cramps, confusion, tingling, or numbness in the hands or feet Osteonecrosis of the jaw--pain, swelling, or redness in the mouth, numbness of the jaw, poor healing after dental work, unusual discharge from the mouth, visible bones in the mouth Severe bone, joint, or muscle pain Side effects that usually do not require medical attention (report to your care team if they continue or are bothersome): Diarrhea Dizziness Headache Nausea Stomach pain Vomiting This list may not describe all possible side effects. Call your doctor for medical advice about side effects. You may report side effects to FDA at 1-800-FDA-1088. Where should I keep my medication? This  medication is given in a hospital or clinic. It will not be stored at home. NOTE: This sheet is a summary. It may not cover all possible information. If you have questions about this medicine, talk to your doctor, pharmacist, or health care provider.  2024 Elsevier/Gold Standard (2021-10-14 00:00:00)

## 2024-03-27 ENCOUNTER — Ambulatory Visit: Payer: Self-pay | Admitting: Obstetrics and Gynecology

## 2024-03-27 ENCOUNTER — Telehealth: Payer: Self-pay | Admitting: Obstetrics and Gynecology

## 2024-03-27 LAB — PARATHYROID HORMONE, INTACT (NO CA): PTH: 25 pg/mL (ref 16–77)

## 2024-03-27 NOTE — Telephone Encounter (Signed)
 Please precert Reclast treatment for my patient.

## 2024-03-28 DIAGNOSIS — I10 Essential (primary) hypertension: Secondary | ICD-10-CM | POA: Diagnosis not present

## 2024-03-28 LAB — BASIC METABOLIC PANEL WITH GFR
BUN: 16 mg/dL (ref 7–25)
CO2: 28 mmol/L (ref 20–32)
Calcium: 10.2 mg/dL (ref 8.6–10.4)
Chloride: 102 mmol/L (ref 98–110)
Creat: 0.86 mg/dL (ref 0.60–1.00)
Glucose, Bld: 87 mg/dL (ref 65–99)
Potassium: 3.8 mmol/L (ref 3.5–5.3)
Sodium: 141 mmol/L (ref 135–146)
eGFR: 72 mL/min/1.73m2 (ref >=60)

## 2024-03-28 LAB — TSH: TSH: 0.98 m[IU]/L (ref 0.40–4.50)

## 2024-03-28 LAB — PHOSPHORUS: Phosphorus: 4.2 mg/dL (ref 2.1–4.3)

## 2024-03-28 LAB — VITAMIN D 25 HYDROXY (VIT D DEFICIENCY, FRACTURES): Vit D, 25-Hydroxy: 65 ng/mL (ref 30–100)

## 2024-03-31 DIAGNOSIS — D649 Anemia, unspecified: Secondary | ICD-10-CM | POA: Diagnosis not present

## 2024-03-31 DIAGNOSIS — Z6825 Body mass index (BMI) 25.0-25.9, adult: Secondary | ICD-10-CM | POA: Diagnosis not present

## 2024-03-31 DIAGNOSIS — J309 Allergic rhinitis, unspecified: Secondary | ICD-10-CM | POA: Diagnosis not present

## 2024-03-31 DIAGNOSIS — E7800 Pure hypercholesterolemia, unspecified: Secondary | ICD-10-CM | POA: Diagnosis not present

## 2024-03-31 DIAGNOSIS — M47896 Other spondylosis, lumbar region: Secondary | ICD-10-CM | POA: Diagnosis not present

## 2024-03-31 DIAGNOSIS — I10 Essential (primary) hypertension: Secondary | ICD-10-CM | POA: Diagnosis not present

## 2024-03-31 DIAGNOSIS — M26609 Unspecified temporomandibular joint disorder, unspecified side: Secondary | ICD-10-CM | POA: Diagnosis not present

## 2024-03-31 DIAGNOSIS — M810 Age-related osteoporosis without current pathological fracture: Secondary | ICD-10-CM | POA: Diagnosis not present

## 2024-04-01 ENCOUNTER — Ambulatory Visit: Payer: MEDICARE | Admitting: Neurology

## 2024-04-10 DIAGNOSIS — E785 Hyperlipidemia, unspecified: Secondary | ICD-10-CM | POA: Diagnosis not present

## 2024-04-10 DIAGNOSIS — I10 Essential (primary) hypertension: Secondary | ICD-10-CM | POA: Diagnosis not present

## 2024-04-27 DIAGNOSIS — I10 Essential (primary) hypertension: Secondary | ICD-10-CM | POA: Diagnosis not present

## 2024-04-28 NOTE — Telephone Encounter (Signed)
 Message left to return call to Gail at [phone].    Need to update patient waiting on Assistant Director to provide update on facility fee schedule.

## 2024-05-01 ENCOUNTER — Other Ambulatory Visit: Payer: Self-pay | Admitting: *Deleted

## 2024-05-01 DIAGNOSIS — M810 Age-related osteoporosis without current pathological fracture: Secondary | ICD-10-CM

## 2024-05-02 ENCOUNTER — Other Ambulatory Visit: Payer: MEDICARE

## 2024-05-02 DIAGNOSIS — M810 Age-related osteoporosis without current pathological fracture: Secondary | ICD-10-CM | POA: Diagnosis not present

## 2024-05-03 ENCOUNTER — Ambulatory Visit: Payer: Self-pay | Admitting: Obstetrics and Gynecology

## 2024-05-03 LAB — CREATININE, SERUM: Creat: 0.89 mg/dL (ref 0.60–1.00)

## 2024-05-03 LAB — CALCIUM: Calcium: 10.1 mg/dL (ref 8.6–10.4)

## 2024-05-07 NOTE — Telephone Encounter (Signed)
 Call to Medicare, spoke with Richardson, representative who states Reclast does require prior authorization. Reference #: [id number].

## 2024-05-08 ENCOUNTER — Telehealth (HOSPITAL_COMMUNITY): Payer: Self-pay | Admitting: Pharmacy Technician

## 2024-05-08 NOTE — Telephone Encounter (Signed)
 Returned call to [employer]. Spoke with Tammy C. States there is no PA required for Reclast. Reference #: [id number]. Order form filled out and taken to Dr. Dorette (covering provider) desk for review and signature. Will fax once signed.  Reclast instructions   Annual Exam (1 year) ? 06/08/22 BS, scheduled 09/30/24   Called pt to verify insurance coverage / inform pt Reclast is in Process ? yes   Labs must be in 30 days window Serum Creatinine DATE? 05/02/24 0.89 Serum Calcium DATE? 05/02/24 10.1   PA needed? No   Cost for Pt? Estimated between $57.90 to $63.26   Order form filled out and faxed  w/MD sig? Yes   Infusion will be done at ? [facility]   Pt aware? yes  Instructions mailed to pt? Yes, address on file approved

## 2024-05-08 NOTE — Telephone Encounter (Signed)
 Call to [facility]. Patient scheduled for 05/16/24 at 0900. Will update patient via MyChart.   Encounter closed.

## 2024-05-08 NOTE — Telephone Encounter (Signed)
 Auth Submission: NO AUTH NEEDED Site of care: [facility] Payer: Medicare A/B, [employer] Supp Medication & CPT/J Code(s) submitted: Reclast (Zolendronic acid) J3489 Diagnosis Code: M81.0 Route of submission (phone, fax, portal):  Phone # Fax # Auth type: Buy/Bill HB Units/visits requested: 5mg  x 1 dose, q 12 mths Reference number:  Approval from: 05/08/24 to 10/11/24  Dagoberto Armour, CPhT [facility] Phone: [phone] 05/08/2024

## 2024-05-09 ENCOUNTER — Other Ambulatory Visit: Payer: Self-pay

## 2024-05-09 DIAGNOSIS — M810 Age-related osteoporosis without current pathological fracture: Secondary | ICD-10-CM | POA: Insufficient documentation

## 2024-05-11 DIAGNOSIS — I10 Essential (primary) hypertension: Secondary | ICD-10-CM | POA: Diagnosis not present

## 2024-05-11 DIAGNOSIS — E785 Hyperlipidemia, unspecified: Secondary | ICD-10-CM | POA: Diagnosis not present

## 2024-05-15 ENCOUNTER — Other Ambulatory Visit (HOSPITAL_COMMUNITY): Payer: Self-pay | Admitting: *Deleted

## 2024-05-16 ENCOUNTER — Ambulatory Visit (HOSPITAL_COMMUNITY)
Admission: RE | Admit: 2024-05-16 | Discharge: 2024-05-16 | Disposition: A | Discharge status: Home / Self Care | Payer: MEDICARE | Source: Ambulatory Visit | Attending: Obstetrics and Gynecology | Admitting: Obstetrics and Gynecology

## 2024-05-16 DIAGNOSIS — M810 Age-related osteoporosis without current pathological fracture: Secondary | ICD-10-CM | POA: Diagnosis not present

## 2024-05-16 MED ORDER — ZOLEDRONIC ACID 5 MG/100ML IV SOLN
INTRAVENOUS | Status: AC
  Filled 2024-05-16: qty 100

## 2024-05-16 MED ORDER — ZOLEDRONIC ACID 5 MG/100ML IV SOLN
5.0000 mg | Freq: Once | INTRAVENOUS | Status: AC
  Administered 2024-05-16: 5 mg via INTRAVENOUS

## 2024-05-27 DIAGNOSIS — I10 Essential (primary) hypertension: Secondary | ICD-10-CM | POA: Diagnosis not present

## 2024-06-02 ENCOUNTER — Ambulatory Visit
Admission: RE | Admit: 2024-06-02 | Discharge: 2024-06-02 | Disposition: A | Discharge status: Home / Self Care | Payer: MEDICARE | Source: Ambulatory Visit | Attending: Family Medicine | Admitting: Family Medicine

## 2024-06-02 DIAGNOSIS — Z1231 Encounter for screening mammogram for malignant neoplasm of breast: Secondary | ICD-10-CM

## 2024-06-05 ENCOUNTER — Ambulatory Visit: Payer: Self-pay | Admitting: Obstetrics and Gynecology

## 2024-06-10 DIAGNOSIS — I10 Essential (primary) hypertension: Secondary | ICD-10-CM | POA: Diagnosis not present

## 2024-06-10 DIAGNOSIS — E785 Hyperlipidemia, unspecified: Secondary | ICD-10-CM | POA: Diagnosis not present

## 2024-06-17 DIAGNOSIS — Z23 Encounter for immunization: Secondary | ICD-10-CM | POA: Diagnosis not present

## 2024-06-26 DIAGNOSIS — I10 Essential (primary) hypertension: Secondary | ICD-10-CM | POA: Diagnosis not present

## 2024-07-11 DIAGNOSIS — E785 Hyperlipidemia, unspecified: Secondary | ICD-10-CM | POA: Diagnosis not present

## 2024-07-11 DIAGNOSIS — I10 Essential (primary) hypertension: Secondary | ICD-10-CM | POA: Diagnosis not present

## 2024-07-26 DIAGNOSIS — I10 Essential (primary) hypertension: Secondary | ICD-10-CM | POA: Diagnosis not present

## 2024-08-10 DIAGNOSIS — I10 Essential (primary) hypertension: Secondary | ICD-10-CM | POA: Diagnosis not present

## 2024-08-10 DIAGNOSIS — E785 Hyperlipidemia, unspecified: Secondary | ICD-10-CM | POA: Diagnosis not present

## 2024-08-19 DIAGNOSIS — D321 Benign neoplasm of spinal meninges: Secondary | ICD-10-CM | POA: Diagnosis not present

## 2024-09-23 ENCOUNTER — Encounter: Payer: Self-pay | Admitting: Obstetrics and Gynecology

## 2024-09-29 ENCOUNTER — Encounter: Payer: Self-pay | Admitting: Obstetrics and Gynecology

## 2024-09-29 NOTE — Progress Notes (Unsigned)
 "  74 y.o. G2P2 Married African American female here for a breast and pelvic exam.    The patient is also followed for osteoporosis. Using Reclast , last on 05/09/24.  Lost about 5 pounds.   Not using vaginal estrogen cream.   Sister passed from a PE after being hospitalized for several weeks following complication from colonoscopy.    PCP: Cleotilde Planas, MD   No LMP recorded. Patient has had a hysterectomy.           Sexually active: No.  The current method of family planning is status post hysterectomy.    Menopausal hormone therapy:  n/a Exercising: Yes.    Walking and house work  Smoker:  no  OB History     Gravida  2   Para  2   Term      Preterm      AB      Living  2      SAB      IAB      Ectopic      Multiple      Live Births              HEALTH MAINTENANCE: Last 2 paps: 06/02/21 neg, HR HPV neg, 05/27/19 neg  History of abnormal Pap or positive HPV:  yes.  Years ago.  No treatment.  Mammogram:  06/02/24 Breast Density Cat C, BIRADS Cat 1 neg  Colonoscopy:  02/2022 Bone Density:  02/14/24  Result  osteoporosis    Immunization History  Administered Date(s) Administered   INFLUENZA, HIGH DOSE SEASONAL PF 05/14/2019   Influenza,inj,quad, With Preservative 06/11/2018   Moderna Covid-19 Fall Seasonal Vaccine 47yrs & older 02/07/2024   PFIZER(Purple Top)SARS-COV-2 Vaccination 10/02/2019, 10/27/2019   Pneumococcal-Unspecified 06/11/2016      reports that she has never smoked. She has never used smokeless tobacco. She reports that she does not drink alcohol and does not use drugs.  Past Medical History:  Diagnosis Date   Anemia    Arthritis    lower anemia    Family history of adverse reaction to anesthesia    SISTER CAN'T TOLERATE MEDS   Family history of bladder cancer    Family history of breast cancer    Family history of colon cancer    GERD (gastroesophageal reflux disease)    hx of    Hay fever    SEASONAL   History of colonic  polyps    HTN (hypertension)    Osteopenia 06/2019   T score -1.6 FRAX 4.5% / 0.6%.  Stable from prior DEXA   Osteoporosis 2025   bilateral hips    Past Surgical History:  Procedure Laterality Date   ABDOMINAL HYSTERECTOMY  1991   AGUS   BREAST BIOPSY  1998   x2 on right    CHOLECYSTECTOMY  04/28/2004   LUMBAR LAMINECTOMY/DECOMPRESSION MICRODISCECTOMY  07/25/2012   Procedure: LUMBAR LAMINECTOMY/DECOMPRESSION MICRODISCECTOMY 2 LEVELS;  Surgeon: Reyes JAYSON Billing, MD;  Location: [facility];  Service: Orthopedics;  Laterality: Left;  Lumbar decompression L3-L4, L4 - L5 on the Left (X-Ray)   NECK SURGERY  07/20/2003   for pinched nerve   PARTIAL HYSTERECTOMY  1991    Current Outpatient Medications  Medication Sig Dispense Refill   CALCIUM PO Take by mouth.     fluticasone (FLONASE) 50 MCG/ACT nasal spray Place 2 sprays into the nose daily as needed for allergies. allergies     gabapentin  (NEURONTIN ) 100 MG capsule 100 mg daily.  hydrochlorothiazide (HYDRODIURIL) 12.5 MG tablet Take 12.5 mg by mouth every morning.     levocetirizine (XYZAL ) 5 MG tablet Take 5 mg by mouth daily as needed for allergies. As needed     lisinopril (PRINIVIL,ZESTRIL) 40 MG tablet Take 40 mg by mouth daily.     montelukast (SINGULAIR) 10 MG tablet      Multiple Vitamin (MULTIVITAMIN) tablet Take 1 tablet by mouth daily.     Omega-3 Fatty Acids (FISH OIL) 1000 MG CAPS Take 1,000 mg by mouth daily.     Rosuvastatin Calcium 5 MG CPSP      traMADol (ULTRAM) 50 MG tablet TAKE 1 TABLET BY MOUTH EVERY DAY AT BEDTIME FOR 30 DAYS     vitamin B-12 (CYANOCOBALAMIN ) 1000 MCG tablet Take 1,000 mcg by mouth daily.     VITAMIN D  PO Take 2,000 Int'l Units by mouth daily.     No current facility-administered medications for this visit.    ALLERGIES: Patient has no known allergies.  Family History  Problem Relation Age of Onset   Hypertension Mother    Breast cancer Mother 16   Arthritis Mother    Colon cancer Mother  72   Heart attack Father 46   Heart disease Father    Breast cancer Sister 4   Hypertension Sister    Arthritis Sister    Neuropathy Sister    Other Sister        H. Pylori   Hypertension Sister    Arthritis Sister    Heart murmur Sister    Hypertension Sister    Arthritis Sister    Heart murmur Sister    Colon cancer Sister 49   Hypertension Sister    Arthritis Sister    Breast cancer Maternal Aunt 87   Colon cancer Maternal Aunt 68   Heart disease Paternal Uncle    Throat cancer Maternal Grandmother    Bladder Cancer Maternal Grandfather    Heart disease Paternal Grandmother    Breast cancer Cousin        mat first cousin   Colon cancer Cousin        >50   Stomach cancer Cousin        >50    Review of Systems  All other systems reviewed and are negative.   PHYSICAL EXAM:  BP 116/78 (BP Location: Left Arm, Patient Position: Sitting)   Pulse 91   Ht 5' 6 (1.676 m)   Wt 140 lb (63.5 kg)   SpO2 98%   BMI 22.60 kg/m     General appearance: alert, cooperative and appears stated age Head: normocephalic, without obvious abnormality, atraumatic Neck: no adenopathy, supple, symmetrical, trachea midline and thyroid  normal to inspection and palpation Lungs: clear to auscultation bilaterally Breasts: normal appearance, no masses or tenderness, No nipple retraction or dimpling, No nipple discharge or bleeding, No axillary adenopathy Heart: regular rate and rhythm Abdomen: soft, non-tender; no masses, no organomegaly Extremities: extremities normal, atraumatic, no cyanosis or edema Skin: skin color, texture, turgor normal. No rashes or lesions Lymph nodes: cervical, supraclavicular, and axillary nodes normal. Neurologic: grossly normal  Pelvic: External genitalia:  no lesions              No abnormal inguinal nodes palpated.              Urethra:  normal appearing urethra with no masses, tenderness or lesions              Bartholins and Skenes: normal  Vagina: normal appearing vagina with normal color and discharge, no lesions              Cervix: absent              Pap taken: no Bimanual Exam:  Uterus:  absent              Adnexa: no mass, fullness, tenderness              Rectal exam: yes.  Confirms.              Anus:  normal sphincter tone, no lesions  Chaperone was present for exam:  Kari HERO, CMA  ASSESSMENT: Encounter for breast and pelvic exam.  Status post TAH/LSO for atypical glandular cells, endometriosis.  Dr. Lomax, 1991. Paps normal following hysterectomy. One ovary remains.  Vaginal atrophy.     Osteoporosis of bilateral hips.  On Reclast .  FH breast cancer in mother and sister.   Neg genetic testing for BRCA 1 and 2.  FH heart disease.  Dyspepsia. Has hiatal hernia.    PLAN: Mammogram screening discussed. Self breast awareness reviewed. Pap and HRV collected:  no.  Not indicated.  Guidelines for Calcium, Vitamin D , regular exercise program including cardiovascular and weight bearing exercise. Medication:  Start estradiol  cream.  Use 1/2 g vaginally every night for the first 2 weeks, then use 1/2 g vaginally two or three times per week as needed to maintain symptom relief.  Dispense:  42.5 gram, RF 2.   Benefits of treating atrophy and prevention of UTI discussed.  Potential effect on breast cancer also reviewed.  Reclast  due in August, 2026.  Next Dexa in June, 2027.  Follow up:  yearly and prn.   Additional counseling given.  yes. 25 min  total time was spent for this patient encounter, including preparation, face-to-face counseling with the patient, coordination of care, and documentation of the encounter in addition to doing the breast and pelvic exam.        "

## 2024-09-30 ENCOUNTER — Encounter: Payer: Self-pay | Admitting: Obstetrics and Gynecology

## 2024-09-30 ENCOUNTER — Ambulatory Visit (INDEPENDENT_AMBULATORY_CARE_PROVIDER_SITE_OTHER): Payer: MEDICARE | Admitting: Obstetrics and Gynecology

## 2024-09-30 VITALS — BP 116/78 | HR 91 | Ht 66.0 in | Wt 140.0 lb

## 2024-09-30 DIAGNOSIS — N952 Postmenopausal atrophic vaginitis: Secondary | ICD-10-CM | POA: Diagnosis not present

## 2024-09-30 DIAGNOSIS — Z5181 Encounter for therapeutic drug level monitoring: Secondary | ICD-10-CM | POA: Diagnosis not present

## 2024-09-30 DIAGNOSIS — M810 Age-related osteoporosis without current pathological fracture: Secondary | ICD-10-CM

## 2024-09-30 DIAGNOSIS — Z01419 Encounter for gynecological examination (general) (routine) without abnormal findings: Secondary | ICD-10-CM | POA: Diagnosis not present

## 2024-09-30 MED ORDER — ESTRADIOL 0.01 % VA CREA: TOPICAL_CREAM | VAGINAL | 2 refills | Status: AC

## 2024-09-30 NOTE — Patient Instructions (Signed)

## 2024-10-02 ENCOUNTER — Telehealth: Payer: Self-pay

## 2024-10-02 NOTE — Telephone Encounter (Signed)
 During previous conversation, patient said she wanted to try to use the coupon first and if it does not work then she will let us  know. She said she could try another option or just do the coconut oil. Will wait to see if she calls back.

## 2024-10-02 NOTE — Telephone Encounter (Signed)
 Patient called and said she was prescribed estrace  vaginal cream and it is expensive. She wants to know if there was an alternative that would be cheaper. Patient is aware Dr Nikki is out of the office today and tomorrow. I sent her a good rx coupon. She said she will try that. If it doesn't work then she said she may just use the coconut oil instead.

## 2024-10-02 NOTE — Telephone Encounter (Signed)
 An alternative estrogen option would be Vagifem  vaginal tablets instead of the cream.  Ok if she wants to try Vagifem , 10 mcg, per vagina at bedtime for 2 weeks and then place 1 tablet per vagina at bedtime twice weekly.  Disp:  34, RF 3 refills of 24 tablets.   Let her know that I do not know how much her insurance changes for the medication when we order it, so I am not sure if the tablets cost less.

## 2024-10-10 ENCOUNTER — Encounter: Payer: Self-pay | Admitting: Obstetrics and Gynecology

## 2025-10-01 ENCOUNTER — Ambulatory Visit: Admitting: Obstetrics and Gynecology
